# Patient Record
Sex: Female | Born: 1995 | Race: White | Hispanic: No | Marital: Married | Smoking: Never smoker
Health system: Southern US, Community
[De-identification: ages and names within clinical notes are randomized; demographics above are authoritative.]

## PROBLEM LIST (undated history)

## (undated) DIAGNOSIS — Z973 Presence of spectacles and contact lenses: Secondary | ICD-10-CM

## (undated) DIAGNOSIS — J45909 Unspecified asthma, uncomplicated: Secondary | ICD-10-CM

## (undated) DIAGNOSIS — J302 Other seasonal allergic rhinitis: Secondary | ICD-10-CM

## (undated) HISTORY — PX: MYOMECTOMY: SHX85

## (undated) HISTORY — PX: NO PAST SURGERIES: SHX2092

## (undated) HISTORY — PX: WISDOM TOOTH EXTRACTION: SHX21

## (undated) HISTORY — DX: Other seasonal allergic rhinitis: J30.2

## (undated) HISTORY — DX: Unspecified asthma, uncomplicated: J45.909

---
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
## 2016-03-29 HISTORY — PX: ORAL MUCOCELE EXCISION: SHX2111

## 2016-06-03 DIAGNOSIS — E7800 Pure hypercholesterolemia, unspecified: Secondary | ICD-10-CM

## 2016-06-03 HISTORY — DX: Pure hypercholesterolemia, unspecified: E78.00

## 2016-12-17 DIAGNOSIS — K130 Diseases of lips: Secondary | ICD-10-CM

## 2016-12-17 HISTORY — DX: Diseases of lips: K13.0

## 2018-02-12 DIAGNOSIS — N6310 Unspecified lump in the right breast, unspecified quadrant: Secondary | ICD-10-CM

## 2018-02-12 HISTORY — DX: Unspecified lump in the right breast, unspecified quadrant: N63.10

## 2019-11-28 ENCOUNTER — Other Ambulatory Visit (HOSPITAL_BASED_OUTPATIENT_CLINIC_OR_DEPARTMENT_OTHER): Payer: Self-pay | Admitting: Physician Assistant

## 2020-05-26 ENCOUNTER — Other Ambulatory Visit (HOSPITAL_BASED_OUTPATIENT_CLINIC_OR_DEPARTMENT_OTHER): Payer: Self-pay | Admitting: Physician Assistant

## 2020-06-06 ENCOUNTER — Other Ambulatory Visit: Payer: Self-pay

## 2020-06-06 ENCOUNTER — Other Ambulatory Visit: Payer: Self-pay | Admitting: Family Medicine

## 2020-06-06 ENCOUNTER — Ambulatory Visit: Payer: 59 | Admitting: Family Medicine

## 2020-06-06 ENCOUNTER — Encounter: Payer: Self-pay | Admitting: Family Medicine

## 2020-06-06 VITALS — BP 110/70 | HR 78 | Temp 97.8°F | Ht 64.0 in | Wt 150.2 lb

## 2020-06-06 DIAGNOSIS — B373 Candidiasis of vulva and vagina: Secondary | ICD-10-CM

## 2020-06-06 DIAGNOSIS — J4520 Mild intermittent asthma, uncomplicated: Secondary | ICD-10-CM | POA: Diagnosis not present

## 2020-06-06 DIAGNOSIS — J45909 Unspecified asthma, uncomplicated: Secondary | ICD-10-CM

## 2020-06-06 DIAGNOSIS — J302 Other seasonal allergic rhinitis: Secondary | ICD-10-CM

## 2020-06-06 DIAGNOSIS — B3731 Acute candidiasis of vulva and vagina: Secondary | ICD-10-CM

## 2020-06-06 HISTORY — DX: Unspecified asthma, uncomplicated: J45.909

## 2020-06-06 MED ORDER — ALBUTEROL SULFATE HFA 108 (90 BASE) MCG/ACT IN AERS: 2.0000 | INHALATION_SPRAY | Freq: Four times a day (QID) | RESPIRATORY_TRACT | 0 refills | Status: DC | PRN

## 2020-06-06 MED ORDER — LEVOCETIRIZINE DIHYDROCHLORIDE 5 MG PO TABS: 5.0000 mg | ORAL_TABLET | Freq: Every evening | ORAL | 2 refills | Status: DC

## 2020-06-06 MED ORDER — FLUCONAZOLE 150 MG PO TABS: ORAL_TABLET | ORAL | 0 refills | Status: DC

## 2020-06-06 NOTE — Patient Instructions (Addendum)
Call Center for [place] at [facility] at [phone] for an appointment.  They are located at [address], [place], [place], [address] (right across the hall from our office).  Use the Diflucan if you need.  Claritin (loratadine), Allegra (fexofenadine), Zyrtec (cetirizine) which is also equivalent to Xyzal (levocetirizine); these are listed in order from weakest to strongest. Generic, and therefore cheaper, options are in the parentheses.   Flonase (fluticasone); nasal spray that is over the counter. 2 sprays each nostril, once daily. Aim towards the same side eye when you spray.  There are available OTC, and the generic versions, which may be cheaper, are in parentheses. Show this to a pharmacist if you have trouble finding any of these items.  Let us know if you need anything.

## 2020-06-06 NOTE — Progress Notes (Signed)
Chief Complaint  Patient presents with  . New Patient (Initial Visit)    Possible yeast infection going on 1.5 weeks        New Patient Visit SUBJECTIVE: HPI: Crystal Soto is an 25 y.o.female who is being seen for establishing care.  Pt dealing w possible yeast infection over past 1.5 weeks. She had been using OTC Monostat with some relief. Still having some itching, not as much white/thick dc. No abd pain, fevers, urinary complaints, bleeding.  Pt w hx of allergies and allergy induced asthma. She has taken her mom's albuterol inhaler in the past that did help. She will intermittently take Claritin that is also helpful. No current breathing issues.   Past Medical History:  Diagnosis Date  . Allergy-induced asthma   . Seasonal allergies    Past Surgical History:  Procedure Laterality Date  . NO PAST SURGERIES     Family History  Problem Relation Age of Onset  . Cancer Neg Hx    No Known Allergies  Current Outpatient Medications:  .  albuterol (VENTOLIN HFA) 108 (90 Base) MCG/ACT inhaler, Inhale 2 puffs into the lungs every 6 (six) hours as needed for wheezing or shortness of breath., Disp: 8 g, Rfl: 0 .  Ascorbic Acid (VITAMIN C) 500 MG CAPS, , Disp: , Rfl:  .  fluconazole (DIFLUCAN) 150 MG tablet, Take 1 tab, repeat in 48 hours if no improvement., Disp: 2 tablet, Rfl: 0 .  levocetirizine (XYZAL) 5 MG tablet, Take 1 tablet (5 mg total) by mouth every evening., Disp: 30 tablet, Rfl: 2 .  levonorgestrel-ethinyl estradiol (ALESSE) 0.1-20 MG-MCG tablet, Take 1 tablet by mouth daily., Disp: , Rfl:  .  Multiple Vitamin (MULTIVITAMIN ADULT) TABS, , Disp: , Rfl:   OBJECTIVE: BP 110/70   Pulse 78   Temp 97.8 F (36.6 C) (Oral)   Ht 5\' 4"  (1.626 m)   Wt 150 lb 3.2 oz (68.1 kg)   SpO2 99%   BMI 25.78 kg/m  General:  well developed, well nourished, in no apparent distress Skin:  no significant moles, warts, or growths Lungs:  clear to auscultation, breath sounds equal  bilaterally, no respiratory distress Cardio:  regular rate and rhythm, no LE edema or bruits Abd: BS+, S, NT, ND Neuro:  gait normal Psych: well oriented with normal range of affect and appropriate judgment/insight  ASSESSMENT/PLAN: Yeast vaginitis - Plan: fluconazole (DIFLUCAN) 150 MG tablet  Mild intermittent extrinsic asthma without complication - Plan: albuterol (VENTOLIN HFA) 108 (90 Base) MCG/ACT inhaler  Seasonal allergies - Plan: levocetirizine (XYZAL) 5 MG tablet  1. May be improving, Diflucan sent it if doesn't resolve.  2. SABA prn.  3. Xyzal prn. Could use INCS.  Patient should return prn. The patient voiced understanding and agreement to the plan.   [place], DO 06/06/20  1:24 PM

## 2020-08-08 ENCOUNTER — Ambulatory Visit: Payer: 59 | Admitting: Family

## 2020-08-08 ENCOUNTER — Other Ambulatory Visit: Payer: Self-pay

## 2020-08-08 VITALS — BP 125/86 | HR 61 | Temp 98.2°F | Resp 16 | Ht 64.0 in | Wt 148.0 lb

## 2020-08-08 DIAGNOSIS — R0789 Other chest pain: Secondary | ICD-10-CM | POA: Diagnosis not present

## 2020-08-08 DIAGNOSIS — R002 Palpitations: Secondary | ICD-10-CM

## 2020-08-08 HISTORY — DX: Palpitations: R00.2

## 2020-08-08 LAB — CBC WITH DIFFERENTIAL/PLATELET
Basophils Absolute: 0 10*3/uL (ref 0.0–0.1)
Basophils Relative: 0.4 % (ref 0.0–3.0)
Eosinophils Absolute: 0.2 10*3/uL (ref 0.0–0.7)
Eosinophils Relative: 3 % (ref 0.0–5.0)
HCT: 45.2 % (ref 36.0–46.0)
Hemoglobin: 15.4 g/dL — ABNORMAL HIGH (ref 12.0–15.0)
Lymphocytes Relative: 38.2 % (ref 12.0–46.0)
Lymphs Abs: 2 10*3/uL (ref 0.7–4.0)
MCHC: 34 g/dL (ref 30.0–36.0)
MCV: 86.3 fl (ref 78.0–100.0)
Monocytes Absolute: 0.3 10*3/uL (ref 0.1–1.0)
Monocytes Relative: 5.4 % (ref 3.0–12.0)
Neutro Abs: 2.7 10*3/uL (ref 1.4–7.7)
Neutrophils Relative %: 53 % (ref 43.0–77.0)
Platelets: 236 10*3/uL (ref 150.0–400.0)
RBC: 5.24 Mil/uL — ABNORMAL HIGH (ref 3.87–5.11)
RDW: 12.4 % (ref 11.5–15.5)
WBC: 5.1 10*3/uL (ref 4.0–10.5)

## 2020-08-08 LAB — TSH: TSH: 3.36 u[IU]/mL (ref 0.35–4.50)

## 2020-08-08 LAB — BASIC METABOLIC PANEL
BUN: 12 mg/dL (ref 6–23)
CO2: 28 mEq/L (ref 19–32)
Calcium: 10.1 mg/dL (ref 8.4–10.5)
Chloride: 104 mEq/L (ref 96–112)
Creatinine, Ser: 0.77 mg/dL (ref 0.40–1.20)
GFR: 107.4 mL/min (ref >60.00)
Glucose, Bld: 78 mg/dL (ref 70–99)
Potassium: 4.6 mEq/L (ref 3.5–5.1)
Sodium: 140 mEq/L (ref 135–145)

## 2020-08-08 NOTE — Patient Instructions (Signed)
Please complete lab work prior to leaving. You should be contacted about scheduling your appointment with the cardiologist.

## 2020-08-08 NOTE — Assessment & Plan Note (Signed)
Suspect PVC's. EKG tracing is personally reviewed.  EKG notes NSR.  No acute changes. Check BMET, CBC, TSH and will refer to cardiology for zio monitor and further evaluation given her long standing hx of palpitations. We did discuss limiting caffeine intake as this can contribute to palpitations.

## 2020-08-08 NOTE — Progress Notes (Signed)
Subjective:   By signing my name below, I, Crystal Soto, attest that this documentation has been prepared under the direction and in the presence of Crystal Soto. 08/08/2020     Patient ID: Crystal Soto, female    DOB: Aug 16, 1995, 25 y.o.   MRN: [id number]  No chief complaint on file.   HPI Patient is in today for a office visit. She complains of having palpitations occasionally for the past 4 years. Yesterday she started having an episode of palpitations and mild hyperventilation after she drank coffee in the morning and ordered some more coffee later that morning. 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  She notes her pulse increasing during inspiration and decreasing during expiration. She denies having any chest pain and anxiety.   Past Medical History:  Diagnosis Date  . Allergy-induced asthma   . Seasonal allergies     Past Surgical History:  Procedure Laterality Date  . NO PAST SURGERIES      Family History  Problem Relation Age of Onset  . Cancer Neg Hx     Social History   Socioeconomic History  . Marital status: Single    Spouse name: Not on file  . Number of children: Not on file  . Years of education: Not on file  . Highest education level: Not on file  Occupational History  . Not on file  Tobacco Use  . Smoking status: Never Smoker  . Smokeless tobacco: Never Used  Substance and Sexual Activity  . Alcohol use: Yes    Alcohol/week: 2.0 standard drinks    Types: 2 Glasses of wine per week    Comment: weekend  . Drug use: Never  . Sexual activity: Yes    Birth control/protection: Pill  Other Topics Concern  . Not on file  Social History Narrative  . Not on file   Social Determinants of Health   Financial Resource Strain: Not on file  Food Insecurity: Not on file  Transportation Needs: Not on file  Physical Activity: Not on file  Stress: Not on file   Social Connections: Not on file  Intimate Partner Violence: Not on file    Outpatient Medications Prior to Visit  Medication Sig Dispense Refill  . albuterol (VENTOLIN HFA) 108 (90 Base) MCG/ACT inhaler INHALE 2 PUFFS BY MOUTH INTO THE LUNGS EVERY 6 (SIX) HOURS AS NEEDED FOR WHEEZING OR SHORTNESS OF BREATH. 18 g 0  . Ascorbic Acid (VITAMIN C) 500 MG CAPS     . fluconazole (DIFLUCAN) 150 MG tablet TAKE 1 TABLET BY MOUTH, REPEAT IN 48 HOURS IF NO IMPROVEMENT. 2 tablet 0  . levocetirizine (XYZAL) 5 MG tablet TAKE 1 TABLET (5 MG TOTAL) BY MOUTH EVERY EVENING. 30 tablet 2  . levonorgestrel-ethinyl estradiol (ALESSE) 0.1-20 MG-MCG tablet Take 1 tablet by mouth daily.    Marland Kitchen levonorgestrel-ethinyl estradiol (ALESSE) 0.1-20 MG-MCG tablet TAKE 1 TABLET BY MOUTH DAILY. 84 tablet 1  . levonorgestrel-ethinyl estradiol (ALESSE) 0.1-20 MG-MCG tablet TAKE 1 TABLET BY MOUTH ONCE DAILY 84 tablet 0  . Multiple Vitamin (MULTIVITAMIN ADULT) TABS      No facility-administered medications prior to visit.    No Known Allergies  ROS     Objective:    Physical Exam Constitutional:      Appearance: Normal appearance.  HENT:     Head: Normocephalic and atraumatic.     Right Ear: External ear  normal.     Left Ear: External ear normal.  Eyes:     Extraocular Movements: Extraocular movements intact.     Pupils: Pupils are equal, round, and reactive to light.  Cardiovascular:     Rate and Rhythm: Normal rate and regular rhythm.     Pulses: Normal pulses.     Heart sounds: Normal heart sounds. No murmur heard. No friction rub. No gallop.   Pulmonary:     Effort: Pulmonary effort is normal. No respiratory distress.     Breath sounds: No stridor. No wheezing, rhonchi or rales.  Skin:    General: Skin is warm and dry.  Neurological:     Mental Status: She is alert and oriented to person, place, and time.  Psychiatric:        Behavior: Behavior normal.     There were no vitals taken for this  visit. Wt Readings from Last 3 Encounters:  06/06/20 150 lb 3.2 oz (68.1 kg)    Diabetic Foot Exam - Simple   No data filed    No results found for: WBC, HGB, HCT, PLT, GLUCOSE, CHOL, TRIG, HDL, LDLDIRECT, LDLCALC, ALT, AST, NA, K, CL, CREATININE, BUN, CO2, TSH, PSA, INR, GLUF, HGBA1C, MICROALBUR  No results found for: TSH No results found for: WBC, HGB, HCT, MCV, PLT No results found for: NA, K, CHLORIDE, CO2, GLUCOSE, BUN, CREATININE, BILITOT, ALKPHOS, AST, ALT, PROT, ALBUMIN, CALCIUM, ANIONGAP, EGFR, GFR No results found for: CHOL No results found for: HDL No results found for: LDLCALC No results found for: TRIG No results found for: CHOLHDL No results found for: HGBA1C     Assessment & Plan:   Problem List Items Addressed This Visit   None      No orders of the defined types were placed in this encounter.   I, Crystal Soto, personally preformed the services described in this documentation.  All medical record entries made by the scribe were at my direction and in my presence.  I have reviewed the chart and discharge instructions (if applicable) and agree that the record reflects my personal performance and is accurate and complete. 08/08/2020   I,Crystal Soto,acting as a Education administrator for Crystal Soto.,have documented all relevant documentation on the behalf of Crystal Soto,as directed by  Crystal Soto while in the presence of Crystal Soto.   Crystal [employer]

## 2020-08-17 ENCOUNTER — Other Ambulatory Visit: Payer: Self-pay

## 2020-08-18 ENCOUNTER — Other Ambulatory Visit (HOSPITAL_BASED_OUTPATIENT_CLINIC_OR_DEPARTMENT_OTHER): Payer: Self-pay

## 2020-08-18 MED ORDER — LEVONORGESTREL-ETHINYL ESTRAD 0.1-20 MG-MCG PO TABS
1.0000 | ORAL_TABLET | Freq: Every morning | ORAL | 1 refills | Status: DC
  Filled 2020-08-18: qty 84, 84d supply, fill #0

## 2020-08-26 ENCOUNTER — Ambulatory Visit: Payer: Self-pay | Admitting: Medical

## 2020-08-26 ENCOUNTER — Other Ambulatory Visit (HOSPITAL_BASED_OUTPATIENT_CLINIC_OR_DEPARTMENT_OTHER): Payer: Self-pay

## 2020-08-27 ENCOUNTER — Other Ambulatory Visit: Payer: Self-pay

## 2020-08-27 ENCOUNTER — Ambulatory Visit (INDEPENDENT_AMBULATORY_CARE_PROVIDER_SITE_OTHER): Payer: 59 | Admitting: Family Medicine

## 2020-08-27 ENCOUNTER — Encounter: Payer: Self-pay | Admitting: Family Medicine

## 2020-08-27 ENCOUNTER — Other Ambulatory Visit (HOSPITAL_BASED_OUTPATIENT_CLINIC_OR_DEPARTMENT_OTHER): Payer: Self-pay

## 2020-08-27 VITALS — BP 108/68 | HR 68 | Temp 98.0°F | Ht 64.0 in | Wt 152.0 lb

## 2020-08-27 DIAGNOSIS — Z114 Encounter for screening for human immunodeficiency virus [HIV]: Secondary | ICD-10-CM | POA: Diagnosis not present

## 2020-08-27 DIAGNOSIS — Z0000 Encounter for general adult medical examination without abnormal findings: Secondary | ICD-10-CM

## 2020-08-27 MED ORDER — LEVONORGESTREL-ETHINYL ESTRAD 0.1-20 MG-MCG PO TABS
1.0000 | ORAL_TABLET | Freq: Every morning | ORAL | 1 refills | Status: DC
  Filled 2020-08-27: qty 84, 84d supply, fill #0
  Filled 2020-11-12: qty 84, 84d supply, fill #1

## 2020-08-27 NOTE — Patient Instructions (Addendum)
Give [place] 2-3 business days to get the results of your labs back.   Keep the diet clean and stay active.  Let us know if you need anything.  Knee Exercises It is normal to feel mild stretching, pulling, tightness, or discomfort as you do these exercises, but you should stop right away if you feel sudden pain or your pain gets worse. STRETCHING AND RANGE OF MOTION EXERCISES  These exercises warm up your muscles and joints and improve the movement and flexibility of your knee. These exercises also help to relieve pain, numbness, and tingling. Exercise A: Knee Extension, Prone  1. Lie on your abdomen on a bed. 2. Place your left / right knee just beyond the edge of the surface so your knee is not on the bed. You can put a towel under your left / right thigh just above your knee for comfort. 3. Relax your leg muscles and allow gravity to straighten your knee. You should feel a stretch behind your left / right knee. 4. Hold this position for 30 seconds. 5. Scoot up so your knee is supported between repetitions. Repeat 2 times. Complete this stretch 3 times per week. Exercise B: Knee Flexion, Active    1. Lie on your back with both knees straight. If this causes back discomfort, bend your left / right knee so your foot is flat on the floor. 2. Slowly slide your left / right heel back toward your buttocks until you feel a gentle stretch in the front of your knee or thigh. 3. Hold this position for 30 seconds. 4. Slowly slide your left / right heel back to the starting position. Repeat 2 times. Complete this exercise 3 times per week. Exercise C: Quadriceps, Prone    1. Lie on your abdomen on a firm surface, such as a bed or padded floor. 2. Bend your left / right knee and hold your ankle. If you cannot reach your ankle or pant leg, loop a belt around your foot and grab the belt instead. 3. Gently pull your heel toward your buttocks. Your knee should not slide out to the side. You should feel  a stretch in the front of your thigh and knee. 4. Hold this position for 30 seconds. Repeat 2 times. Complete this stretch 3 times per week. Exercise D: Hamstring, Supine  1. Lie on your back. 2. Loop a belt or towel over the ball of your left / right foot. The ball of your foot is on the walking surface, right under your toes. 3. Straighten your left / right knee and slowly pull on the belt to raise your leg until you feel a gentle stretch behind your knee. ? Do not let your left / right knee bend while you do this. ? Keep your other leg flat on the floor. 4. Hold this position for 30 seconds. Repeat 2 times. Complete this stretch 3 times per week. STRENGTHENING EXERCISES  These exercises build strength and endurance in your knee. Endurance is the ability to use your muscles for a long time, even after they get tired. Exercise E: Quadriceps, Isometric    1. Lie on your back with your left / right leg extended and your other knee bent. Put a rolled towel or small pillow under your knee if told by your health care provider. 2. Slowly tense the muscles in the front of your left / right thigh. You should see your kneecap slide up toward your hip or see increased dimpling just  above the knee. This motion will push the back of the knee toward the floor. 3. For 3 seconds, keep the muscle as tight as you can without increasing your pain. 4. Relax the muscles slowly and completely. Repeat for 10 total reps Repeat 2 ti mes. Complete this exercise 3 times per week. Exercise F: Straight Leg Raises - Quadriceps  1. Lie on your back with your left / right leg extended and your other knee bent. 2. Tense the muscles in the front of your left / right thigh. You should see your kneecap slide up or see increased dimpling just above the knee. Your thigh may even shake a bit. 3. Keep these muscles tight as you raise your leg 4-6 inches (10-15 cm) off the floor. Do not let your knee bend. 4. Hold this position  for 3 seconds. 5. Keep these muscles tense as you lower your leg. 6. Relax your muscles slowly and completely after each repetition. 10 total reps. Repeat 2 times. Complete this exercise 3 times per week.  Exercise G: Hamstring Curls    If told by your health care provider, do this exercise while wearing ankle weights. Begin with 5 lb weights (optional). Then increase the weight by 1 lb (0.5 kg) increments. Do not wear ankle weights that are more than 20 lbs to start with. 1. Lie on your abdomen with your legs straight. 2. Bend your left / right knee as far as you can without feeling pain. Keep your hips flat against the floor. 3. Hold this position for 3 seconds. 4. Slowly lower your leg to the starting position. Repeat for 10 reps.  Repeat 2 times. Complete this exercise 3 times per week. Exercise H: Squats (Quadriceps)  1. Stand in front of a table, with your feet and knees pointing straight ahead. You may rest your hands on the table for balance but not for support. 2. Slowly bend your knees and lower your hips like you are going to sit in a chair. ? Keep your weight over your heels, not over your toes. ? Keep your lower legs upright so they are parallel with the table legs. ? Do not let your hips go lower than your knees. ? Do not bend lower than told by your health care provider. ? If your knee pain increases, do not bend as low. 3. Hold the squat position for 1 second. 4. Slowly push with your legs to return to standing. Do not use your hands to pull yourself to standing. Repeat 2 times. Complete this exercise 3 times per week. Exercise I: Wall Slides (Quadriceps)    1. Lean your back against a smooth wall or door while you walk your feet out 18-24 inches (46-61 cm) from it. 2. Place your feet hip-width apart. 3. Slowly slide down the wall or door until your knees Repeat 2 times. Complete this exercise every other day. 4. Exercise K: Straight Leg Raises - Hip Abductors   1. Lie on your side with your left / right leg in the top position. Lie so your head, shoulder, knee, and hip line up. You may bend your bottom knee to help you keep your balance. 2. Roll your hips slightly forward so your hips are stacked directly over each other and your left / right knee is facing forward. 3. Leading with your heel, lift your top leg 4-6 inches (10-15 cm). You should feel the muscles in your outer hip lifting. ? Do not let your foot drift forward. ?  Do not let your knee roll toward the ceiling. 4. Hold this position for 3 seconds. 5. Slowly return your leg to the starting position. 6. Let your muscles relax completely after each repetition. 10 total reps. Repeat 2 times. Complete this exercise 3 times per week. Exercise J: Straight Leg Raises - Hip Extensors  1. Lie on your abdomen on a firm surface. You can put a pillow under your hips if that is more comfortable. 2. Tense the muscles in your buttocks and lift your left / right leg about 4-6 inches (10-15 cm). Keep your knee straight as you lift your leg. 3. Hold this position for 3 seconds. 4. Slowly lower your leg to the starting position. 5. Let your leg relax completely after each repetition. Repeat 2 times. Complete this exercise 3 times per week. Document Released: 01/27/2005 Document Revised: 12/08/2015 Document Reviewed: 01/19/2015 Elsevier Interactive Patient Education  2017 [employer].

## 2020-08-27 NOTE — Progress Notes (Signed)
Chief Complaint  Patient presents with  . Annual Exam     Well Woman Crystal Soto is here for a complete physical.   Her last physical was last year.  Current diet: in general, a "healthy" diet. Current exercise: walking. Contraception? Yes Fatigue out of ordinary? No Seatbelt? Yes  Health Maintenance Pap/HPV- Yes Tetanus- Yes HIV screening- No Hep C screening- Yes  Past Medical History:  Diagnosis Date  . Allergy-induced asthma   . Seasonal allergies      Past Surgical History:  Procedure Laterality Date  . NO PAST SURGERIES      Medications  Current Outpatient Medications on File Prior to Visit  Medication Sig Dispense Refill  . albuterol (VENTOLIN HFA) 108 (90 Base) MCG/ACT inhaler INHALE 2 PUFFS BY MOUTH INTO THE LUNGS EVERY 6 (SIX) HOURS AS NEEDED FOR WHEEZING OR SHORTNESS OF BREATH. 18 g 0  . Ascorbic Acid (VITAMIN C) 500 MG CAPS     . fluconazole (DIFLUCAN) 150 MG tablet TAKE 1 TABLET BY MOUTH, REPEAT IN 48 HOURS IF NO IMPROVEMENT. 2 tablet 0  . levonorgestrel-ethinyl estradiol (ALESSE) 0.1-20 MG-MCG tablet TAKE 1 TABLET BY MOUTH ONCE DAILY 84 tablet 0  . levonorgestrel-ethinyl estradiol (ALESSE) 0.1-20 MG-MCG tablet Take 1 tablet by mouth in the morning. 84 tablet 1  . Multiple Vitamin (MULTIVITAMIN ADULT) TABS       Allergies No Known Allergies  Review of Systems: Constitutional:  no unexpected weight changes Eye:  no recent significant change in vision Ear/Nose/Mouth/Throat:  Ears:  no tinnitus or vertigo and no recent change in hearing Nose/Mouth/Throat:  no complaints of nasal congestion, no sore throat Cardiovascular: no chest pain Respiratory:  no cough and no shortness of breath Gastrointestinal:  no abdominal pain, no change in bowel habits GU:  Female: negative for dysuria or pelvic pain Musculoskeletal/Extremities:  +R knee pain; otherwise no new pain of the joints Integumentary (Skin/Breast):  no abnormal skin lesions  reported Neurologic:  no headaches Endocrine:  denies fatigue Hematologic/Lymphatic:  No areas of easy bleeding  Exam BP 108/68 (BP Location: Left Arm, Patient Position: Sitting, Cuff Size: Normal)   Pulse 68   Temp 98 F (36.7 C) (Oral)   Ht 5\' 4"  (1.626 m)   Wt 152 lb (68.9 kg)   SpO2 99%   BMI 26.09 kg/m  General:  well developed, well nourished, in no apparent distress Skin:  no significant moles, warts, or growths Head:  no masses, lesions, or tenderness Eyes:  pupils equal and round, sclera anicteric without injection Ears:  canals without lesions, TMs shiny without retraction, no obvious effusion, no erythema Nose:  nares patent, septum midline, mucosa normal, and no drainage or sinus tenderness Throat/Pharynx:  lips and gingiva without lesion; tongue and uvula midline; non-inflamed pharynx; no exudates or postnasal drainage Neck: neck supple without adenopathy, thyromegaly, or masses Lungs:  clear to auscultation, breath sounds equal bilaterally, no respiratory distress Cardio:  regular rate and rhythm, no bruits, no LE edema Abdomen:  abdomen soft, nontender; bowel sounds normal; no masses or organomegaly Genital: Defer to GYN Musculoskeletal:  symmetrical muscle groups noted without atrophy or deformity Extremities:  no clubbing, cyanosis, or edema, no deformities, no skin discoloration Neuro:  gait normal; deep tendon reflexes normal and symmetric Psych: well oriented with normal range of affect and appropriate judgment/insight  Assessment and Plan  Well adult exam - Plan: CBC, Comprehensive metabolic panel, Lipid panel  Screening for HIV (human immunodeficiency virus) - Plan: HIV Antibody (routine  testing w rflx)   Well 25 y.o. female. Counseled on diet and exercise. Other orders as above. Mild strain to quad. Will give stretches/exercises. PT if no better.  Follow up in . The patient voiced understanding and agreement to the plan.  [place],  DO 08/27/20 8:38 AM

## 2020-09-04 NOTE — Addendum Note (Signed)
Addended by: Kelle Darting A on: 09/04/2020 08:51 AM   Modules accepted: Orders

## 2020-09-05 ENCOUNTER — Other Ambulatory Visit (INDEPENDENT_AMBULATORY_CARE_PROVIDER_SITE_OTHER): Payer: 59

## 2020-09-05 ENCOUNTER — Other Ambulatory Visit: Payer: Self-pay

## 2020-09-05 DIAGNOSIS — Z0000 Encounter for general adult medical examination without abnormal findings: Secondary | ICD-10-CM | POA: Diagnosis not present

## 2020-09-05 DIAGNOSIS — Z114 Encounter for screening for human immunodeficiency virus [HIV]: Secondary | ICD-10-CM

## 2020-09-05 LAB — COMPREHENSIVE METABOLIC PANEL
ALT: 19 U/L (ref 0–35)
AST: 17 U/L (ref 0–37)
Albumin: 4.7 g/dL (ref 3.5–5.2)
Alkaline Phosphatase: 44 U/L (ref 39–117)
BUN: 12 mg/dL (ref 6–23)
CO2: 26 mEq/L (ref 19–32)
Calcium: 9.6 mg/dL (ref 8.4–10.5)
Chloride: 105 mEq/L (ref 96–112)
Creatinine, Ser: 0.79 mg/dL (ref 0.40–1.20)
GFR: 104.09 mL/min (ref >60.00)
Glucose, Bld: 83 mg/dL (ref 70–99)
Potassium: 4.7 mEq/L (ref 3.5–5.1)
Sodium: 139 mEq/L (ref 135–145)
Total Bilirubin: 0.9 mg/dL (ref 0.2–1.2)
Total Protein: 6.8 g/dL (ref 6.0–8.3)

## 2020-09-05 LAB — CBC
HCT: 43 % (ref 36.0–46.0)
Hemoglobin: 14.8 g/dL (ref 12.0–15.0)
MCHC: 34.4 g/dL (ref 30.0–36.0)
MCV: 86.8 fl (ref 78.0–100.0)
Platelets: 229 10*3/uL (ref 150.0–400.0)
RBC: 4.95 Mil/uL (ref 3.87–5.11)
RDW: 12.9 % (ref 11.5–15.5)
WBC: 5.5 10*3/uL (ref 4.0–10.5)

## 2020-09-05 LAB — LIPID PANEL
Cholesterol: 208 mg/dL — ABNORMAL HIGH (ref 0–200)
HDL: 50.3 mg/dL (ref >39.00)
LDL Cholesterol: 141 mg/dL — ABNORMAL HIGH (ref 0–99)
NonHDL: 158.03
Total CHOL/HDL Ratio: 4
Triglycerides: 87 mg/dL (ref 0.0–149.0)
VLDL: 17.4 mg/dL (ref 0.0–40.0)

## 2020-09-08 LAB — HIV ANTIBODY (ROUTINE TESTING W REFLEX): HIV 1&2 Ab, 4th Generation: NONREACTIVE

## 2020-09-17 ENCOUNTER — Other Ambulatory Visit: Payer: Self-pay | Admitting: Family Medicine

## 2020-09-17 ENCOUNTER — Other Ambulatory Visit (HOSPITAL_BASED_OUTPATIENT_CLINIC_OR_DEPARTMENT_OTHER): Payer: Self-pay

## 2020-09-17 MED ORDER — PROMETHAZINE HCL 25 MG PO TABS
25.0000 mg | ORAL_TABLET | Freq: Three times a day (TID) | ORAL | 0 refills | Status: DC | PRN
  Filled 2020-09-17: qty 30, 10d supply, fill #0

## 2020-09-25 ENCOUNTER — Encounter: Payer: Self-pay | Admitting: Cardiology

## 2020-09-25 ENCOUNTER — Ambulatory Visit (INDEPENDENT_AMBULATORY_CARE_PROVIDER_SITE_OTHER): Payer: 59

## 2020-09-25 ENCOUNTER — Other Ambulatory Visit: Payer: Self-pay

## 2020-09-25 ENCOUNTER — Ambulatory Visit (INDEPENDENT_AMBULATORY_CARE_PROVIDER_SITE_OTHER): Payer: 59 | Admitting: Cardiology

## 2020-09-25 VITALS — BP 122/68 | HR 78 | Ht 64.0 in | Wt 151.1 lb

## 2020-09-25 DIAGNOSIS — E7800 Pure hypercholesterolemia, unspecified: Secondary | ICD-10-CM

## 2020-09-25 DIAGNOSIS — R011 Cardiac murmur, unspecified: Secondary | ICD-10-CM

## 2020-09-25 DIAGNOSIS — R002 Palpitations: Secondary | ICD-10-CM

## 2020-09-25 HISTORY — DX: Cardiac murmur, unspecified: R01.1

## 2020-09-25 NOTE — Patient Instructions (Signed)
Medication Instructions:  No medication changes. *If you need a refill on your cardiac medications before your next appointment, please call your pharmacy*   Lab Work: None ordered If you have labs (blood work) drawn today and your tests are completely normal, you will receive your results only by: Dora (if you have MyChart) OR A paper copy in the mail If you have any lab test that is abnormal or we need to change your treatment, we will call you to review the results.   Testing/Procedures: Your physician has requested that you have an echocardiogram. Echocardiography is a painless test that uses sound waves to create images of your heart. It provides your doctor with information about the size and shape of your heart and how well your heart's chambers and valves are working. This procedure takes approximately one hour. There are no restrictions for this procedure.   WHY IS MY DOCTOR PRESCRIBING ZIO? The Zio system is proven and trusted by physicians to detect and diagnose irregular heart rhythms -- and has been prescribed to hundreds of thousands of patients.  The FDA has cleared the Zio system to monitor for many different kinds of irregular heart rhythms. In a study, physicians were able to reach a diagnosis 90% of the time with the Zio system1.  You can wear the Zio monitor -- a small, discreet, comfortable patch -- during your normal day-to-day activity, including while you sleep, shower, and exercise, while it records every single heartbeat for analysis.  1Barrett, P., et al. Comparison of 24 Hour Holter Monitoring Versus 14 Day Novel Adhesive Patch Electrocardiographic Monitoring. [place], 2014.  ZIO VS. HOLTER MONITORING The Zio monitor can be comfortably worn for up to 14 days. Holter monitors can be worn for 24 to 48 hours, limiting the time to record any irregular heart rhythms you may have. Zio is able to capture data for the 51% of patients who  have their first symptom-triggered arrhythmia after 48 hours.1  LIVE WITHOUT RESTRICTIONS The Zio ambulatory cardiac monitor is a small, unobtrusive, and water-resistant patch--you might even forget you're wearing it. The Zio monitor records and stores every beat of your heart, whether you're sleeping, working out, or showering. Wear the monitor for 14 days, remove 10/09/20.  Follow-Up: At [facility], you and your health needs are our priority.  As part of our continuing mission to provide you with exceptional heart care, we have created designated Provider Care Teams.  These Care Teams include your primary Cardiologist (physician) and Advanced Practice Providers (APPs -  Physician Assistants and Nurse Practitioners) who all work together to provide you with the care you need, when you need it.  We recommend signing up for the patient portal called "MyChart".  Sign up information is provided on this After Visit Summary.  MyChart is used to connect with patients for Virtual Visits (Telemedicine).  Patients are able to view lab/test results, encounter notes, upcoming appointments, etc.  Non-urgent messages can be sent to your provider as well.   To learn more about what you can do with MyChart, go to [email or web address].    Your next appointment:   6 month(s)  The format for your next appointment:   In Person  Provider:   Jyl Heinz, MD   Other Instructions Echocardiogram An echocardiogram is a test that uses sound waves (ultrasound) to produce images of the heart. Images from an echocardiogram can provide important information about: Heart size and shape. The size and thickness and movement  of your heart's walls. Heart muscle function and strength. Heart valve function or if you have stenosis. Stenosis is when the heart valves are too narrow. If blood is flowing backward through the heart valves (regurgitation). A tumor or infectious growth around the heart valves. Areas of  heart muscle that are not working well because of poor blood flow or injury from a heart attack. Aneurysm detection. An aneurysm is a weak or damaged part of an artery wall. The wall bulges out from the normal force of blood pumping through the body. Tell a health care provider about: Any allergies you have. All medicines you are taking, including vitamins, herbs, eye drops, creams, and over-the-counter medicines. Any blood disorders you have. Any surgeries you have had. Any medical conditions you have. Whether you are pregnant or may be pregnant. What are the risks? Generally, this is a safe test. However, problems may occur, including an allergic reaction to dye (contrast) that may be used during the test. What happens before the test? No specific preparation is needed. You may eat and drink normally. What happens during the test? You will take off your clothes from the waist up and put on a hospital gown. Electrodes or electrocardiogram (ECG)patches may be placed on your chest. The electrodes or patches are then connected to a device that monitors your heart rate and rhythm. You will lie down on a table for an ultrasound exam. A gel will be applied to your chest to help sound waves pass through your skin. A handheld device, called a transducer, will be pressed against your chest and moved over your heart. The transducer produces sound waves that travel to your heart and bounce back (or "echo" back) to the transducer. These sound waves will be captured in real-time and changed into images of your heart that can be viewed on a video monitor. The images will be recorded on a computer and reviewed by your health care provider. You may be asked to change positions or hold your breath for a short time. This makes it easier to get different views or better views of your heart. In some cases, you may receive contrast through an IV in one of your veins. This can improve the quality of the pictures from  your heart. The procedure may vary among health care providers and hospitals.   What can I expect after the test? You may return to your normal, everyday life, including diet, activities, and medicines, unless your health care provider tells you not to do that. Follow these instructions at home: It is up to you to get the results of your test. Ask your health care provider, or the department that is doing the test, when your results will be ready. Keep all follow-up visits. This is important. Summary An echocardiogram is a test that uses sound waves (ultrasound) to produce images of the heart. Images from an echocardiogram can provide important information about the size and shape of your heart, heart muscle function, heart valve function, and other possible heart problems. You do not need to do anything to prepare before this test. You may eat and drink normally. After the echocardiogram is completed, you may return to your normal, everyday life, unless your health care provider tells you not to do that. This information is not intended to replace advice given to you by your health care provider. Make sure you discuss any questions you have with your health care provider. Document Revised: 11/06/2019 Document Reviewed: 11/06/2019 Elsevier Patient Education  [place].

## 2020-09-25 NOTE — Progress Notes (Signed)
Cardiology Office Note:    Date:  09/25/2020   ID:  Crystal Soto, DOB [birth year], MRN [id number]  PCP:  Shelda Pal, DO  Cardiologist:  Jenean Lindau, MD   Referring MD: Debbrah Alar, NP    ASSESSMENT:    1. Murmur, cardiac   2. Palpitations   3. Cardiac murmur   4. Hypercholesteremia    PLAN:    In order of problems listed above:  Primary prevention stressed with the patient.  Importance of compliance with diet medication stressed and she vocalized understanding.  She was advised to walk at least walk half an hour a day on a regular basis and she promises to do so. 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  She is a Marine scientist by profession.  After her coffee intake has decreased she has had no issues with this. Palpitations: She is concerned about it.  TSH was normal.  We will do a 2-week monitor.  I also advised her to purchase a cardia app to monitor herself on a as needed basis.  She agrees. Cardiac murmur: Echocardiogram will be done to assess murmur on auscultation. Mixed dyslipidemia: Diet was emphasized.  Lipids were discussed and risks explained and she vocalized understanding.  She promises to comply. Patient will be seen in follow-up appointment in 6 months or earlier if the patient has any concerns    Medication Adjustments/Labs and Tests Ordered: Current medicines are reviewed at length with the patient today.  Concerns regarding medicines are outlined above.  Orders Placed This Encounter  Procedures   LONG TERM MONITOR (3-14 DAYS)   EKG 12-Lead   ECHOCARDIOGRAM COMPLETE   No orders of the defined types were placed in this encounter.    History of Present Illness:    Crystal Soto is a 25 y.o. female who is being seen today for the evaluation of palpitations at the request of Debbrah Alar, NP.  Patient is a pleasant 25 year old female.  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  She mentions to me  that she had extra 2 coffees the other day and subsequently started having palpitations and was noted to have PVCs.  No chest pain orthopnea or PND.  She is an otherwise healthy female.  She is active and exercises on a regular basis.  She has never had passing out spell.  At the time of my evaluation, the patient is alert awake oriented and in no distress.  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 Right breast.  Follow by Dr. Duke Salvia.  Stable.  No intervention.   Hypercholesteremia 06/03/2016   Mucocele of lower lip 12/17/2016   Palpitations 08/08/2020   Seasonal allergies     Past Surgical History:  Procedure Laterality Date   NO PAST SURGERIES      Current Medications: Current Meds  Medication Sig   albuterol (VENTOLIN HFA) 108 (90 Base) MCG/ACT inhaler Inhale 2 puffs into the lungs every 6 (six) hours as needed for wheezing or shortness of breath.   Ascorbic Acid (VITAMIN C) 500 MG CAPS Take 500 mg by mouth daily.   levonorgestrel-ethinyl estradiol (ALESSE) 0.1-20 MG-MCG tablet Take 1 tablet by mouth in the morning.   Multiple Vitamin (MULTIVITAMIN ADULT) TABS Take 1 tablet by mouth daily.   promethazine (  PHENERGAN) 25 MG tablet Take 1 tablet (25 mg total) by mouth every 8 (eight) hours as needed for nausea or vomiting (Anxiety).     Allergies:   Patient has no known allergies.   Social History   Socioeconomic History   Marital status: Single    Spouse name: Not on file   Number of children: Not on file   Years of education: Not on file   Highest education level: Not on file  Occupational History   Not on file  Tobacco Use   Smoking status: Never   Smokeless tobacco: Never  Substance and Sexual Activity   Alcohol use: Yes    Alcohol/week: 2.0 standard drinks    Types: 2 Glasses of wine per  week    Comment: weekend   Drug use: Never   Sexual activity: Yes    Birth control/protection: Pill  Other Topics Concern   Not on file  Social History Narrative   Not on file   Social Determinants of Health   Financial Resource Strain: Not on file  Food Insecurity: Not on file  Transportation Needs: Not on file  Physical Activity: Not on file  Stress: Not on file  Social Connections: Not on file     Family History: The patient's family history includes Cancer in her maternal grandmother; Heart disease in her paternal grandfather; High Cholesterol in her maternal grandfather, maternal grandmother, and mother; Hypertension in her maternal grandfather.  ROS:   Please see the history of present illness.    All other systems reviewed and are negative.  EKGs/Labs/Other Studies Reviewed:    The following studies were reviewed today: EKG reveals sinus rhythm and nonspecific ST-T changes   Recent Labs: 08/08/2020: TSH 3.36 09/05/2020: ALT 19; BUN 12; Creatinine, Ser 0.79; Hemoglobin 14.8; Platelets 229.0; Potassium 4.7; Sodium 139  Recent Lipid Panel    Component Value Date/Time   CHOL 208 (H) 09/05/2020 0752   TRIG 87.0 09/05/2020 0752   HDL 50.30 09/05/2020 0752   CHOLHDL 4 09/05/2020 0752   VLDL 17.4 09/05/2020 0752   LDLCALC 141 (H) 09/05/2020 0752    Physical Exam:    VS:  BP 122/68   Pulse 78   Ht 5\' 4"  (1.626 m)   Wt 151 lb 1.3 oz (68.5 kg)   SpO2 99%   BMI 25.93 kg/m     Wt Readings from Last 3 Encounters:  09/25/20 151 lb 1.3 oz (68.5 kg)  08/27/20 152 lb (68.9 kg)  08/08/20 148 lb (67.1 kg)     GEN: Patient is in no acute distress HEENT: Normal NECK: No JVD; No carotid bruits LYMPHATICS: No lymphadenopathy CARDIAC: S1 S2 regular, 2/6 systolic murmur at the apex. RESPIRATORY:  Clear to auscultation without rales, wheezing or rhonchi  ABDOMEN: Soft, non-tender, non-distended MUSCULOSKELETAL:  No edema; No deformity  SKIN: Warm and dry NEUROLOGIC:   Alert and oriented x 3 PSYCHIATRIC:  Normal affect    Signed, Jenean Lindau, MD  09/25/2020 10:40 AM    [facility]

## 2020-10-14 DIAGNOSIS — R002 Palpitations: Secondary | ICD-10-CM | POA: Diagnosis not present

## 2020-10-15 ENCOUNTER — Other Ambulatory Visit: Payer: Self-pay

## 2020-10-15 ENCOUNTER — Ambulatory Visit (HOSPITAL_BASED_OUTPATIENT_CLINIC_OR_DEPARTMENT_OTHER)
Admission: RE | Admit: 2020-10-15 | Discharge: 2020-10-15 | Disposition: A | Discharge status: Home / Self Care | Payer: 59 | Source: Ambulatory Visit | Attending: Cardiology | Admitting: Cardiology

## 2020-10-15 DIAGNOSIS — R011 Cardiac murmur, unspecified: Secondary | ICD-10-CM | POA: Insufficient documentation

## 2020-10-15 LAB — ECHOCARDIOGRAM COMPLETE
AR max vel: 3.27 cm2
AV Area VTI: 2.97 cm2
AV Area mean vel: 3.13 cm2
AV Mean grad: 4 mmHg
AV Peak grad: 7.4 mmHg
Ao pk vel: 1.36 m/s
Area-P 1/2: 4.96 cm2
Calc EF: 57.3 %
S' Lateral: 3.3 cm
Single Plane A2C EF: 53.8 %
Single Plane A4C EF: 61.1 %

## 2020-11-05 ENCOUNTER — Other Ambulatory Visit (HOSPITAL_BASED_OUTPATIENT_CLINIC_OR_DEPARTMENT_OTHER): Payer: Self-pay

## 2020-11-05 MED ORDER — CARESTART COVID-19 HOME TEST VI KIT
PACK | 0 refills | Status: DC
  Filled 2020-11-05: qty 2, 4d supply, fill #0

## 2020-11-12 ENCOUNTER — Other Ambulatory Visit (HOSPITAL_BASED_OUTPATIENT_CLINIC_OR_DEPARTMENT_OTHER): Payer: Self-pay

## 2020-12-04 DIAGNOSIS — U071 COVID-19: Secondary | ICD-10-CM | POA: Insufficient documentation

## 2020-12-04 HISTORY — DX: COVID-19: U07.1

## 2020-12-31 ENCOUNTER — Encounter: Payer: 59 | Admitting: Family Medicine

## 2021-01-14 ENCOUNTER — Other Ambulatory Visit (HOSPITAL_BASED_OUTPATIENT_CLINIC_OR_DEPARTMENT_OTHER): Payer: Self-pay

## 2021-01-14 MED ORDER — INFLUENZA VAC SPLIT QUAD 0.5 ML IM SUSY
PREFILLED_SYRINGE | INTRAMUSCULAR | 0 refills | Status: DC
  Filled 2021-01-14: qty 0.5, 1d supply, fill #0

## 2021-01-15 ENCOUNTER — Encounter: Payer: Self-pay | Admitting: Family Medicine

## 2021-01-15 ENCOUNTER — Ambulatory Visit: Payer: 59 | Admitting: Family Medicine

## 2021-01-15 ENCOUNTER — Other Ambulatory Visit: Payer: Self-pay

## 2021-01-15 VITALS — BP 98/80 | HR 81 | Temp 97.8°F | Resp 18 | Ht 64.0 in | Wt 154.0 lb

## 2021-01-15 DIAGNOSIS — R198 Other specified symptoms and signs involving the digestive system and abdomen: Secondary | ICD-10-CM

## 2021-01-15 NOTE — Patient Instructions (Signed)
Someone will reach out in the next week or so regarding scheduling your ultrasound.   Let us know if you need anything.

## 2021-01-15 NOTE — Progress Notes (Signed)
Chief Complaint  Patient presents with   Mass    X2 month, abdominal lump, Pt states no pain, or redness, no urinary freq, Pt states she notices it more when she has use the bathroom.     Subjective: Patient is a 25 y.o. female here for abd issue.  Around 2 months ago the patient noticed some fullness in her lower abdominal region, deviating to the left, when she was on her cycle.  When she has to urinate it feels like it rises higher in her abdomen.  She does have a family history of uterine fibroids.  She is recently married and is interested in having kids.  She is wondering if she requires any imaging to rule out anything that would cause her any issues with this.  She denies any fevers, vaginal bleeding, urinary/bowel changes, unintentional weight loss, nausea, or vomiting.  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 Right breast.  Follow by Dr. Duke Salvia.  Stable.  No intervention.   Hypercholesteremia 06/03/2016   Mucocele of lower lip 12/17/2016   Palpitations 08/08/2020   Seasonal allergies     Objective: BP 98/80 (BP Location: Left Arm, Patient Position: Sitting, Cuff Size: Normal)   Pulse 81   Temp 97.8 F (36.6 C) (Oral)   Resp 18   Ht 5\' 4"  (1.626 m)   Wt 154 lb (69.9 kg)   LMP 12/30/2020   SpO2 98%   BMI 26.43 kg/m  General: Awake, appears stated age Abdomen: Bowel sounds present, soft, nontender, nondistended, no masses or organomegaly Skin: No rashes or deformities on the abdominal wall Lungs: No accessory muscle use Psych: Age appropriate judgment and insight, normal affect and mood  Assessment and Plan: Abdominal fullness in left lower quadrant - Plan: US Pelvic Complete With Transvaginal  Check ultrasound, 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  Further  management pending the results. The patient voiced understanding and agreement to the plan.  [place], DO 01/15/21  2:00 PM

## 2021-01-23 ENCOUNTER — Ambulatory Visit (HOSPITAL_BASED_OUTPATIENT_CLINIC_OR_DEPARTMENT_OTHER)
Admission: RE | Admit: 2021-01-23 | Discharge: 2021-01-23 | Disposition: A | Discharge status: Home / Self Care | Payer: 59 | Source: Ambulatory Visit | Attending: Family Medicine | Admitting: Family Medicine

## 2021-01-23 ENCOUNTER — Other Ambulatory Visit: Payer: Self-pay | Admitting: Family Medicine

## 2021-01-23 ENCOUNTER — Other Ambulatory Visit: Payer: Self-pay

## 2021-01-23 DIAGNOSIS — N9489 Other specified conditions associated with female genital organs and menstrual cycle: Secondary | ICD-10-CM

## 2021-01-23 DIAGNOSIS — R198 Other specified symptoms and signs involving the digestive system and abdomen: Secondary | ICD-10-CM | POA: Insufficient documentation

## 2021-01-23 DIAGNOSIS — R1904 Left lower quadrant abdominal swelling, mass and lump: Secondary | ICD-10-CM | POA: Diagnosis not present

## 2021-01-23 DIAGNOSIS — D259 Leiomyoma of uterus, unspecified: Secondary | ICD-10-CM | POA: Diagnosis not present

## 2021-01-23 DIAGNOSIS — N852 Hypertrophy of uterus: Secondary | ICD-10-CM | POA: Diagnosis not present

## 2021-02-01 ENCOUNTER — Other Ambulatory Visit: Payer: Self-pay | Admitting: Family Medicine

## 2021-02-02 ENCOUNTER — Other Ambulatory Visit (HOSPITAL_BASED_OUTPATIENT_CLINIC_OR_DEPARTMENT_OTHER): Payer: Self-pay

## 2021-02-02 MED ORDER — LEVONORGESTREL-ETHINYL ESTRAD 0.1-20 MG-MCG PO TABS
1.0000 | ORAL_TABLET | Freq: Every morning | ORAL | 1 refills | Status: DC
  Filled 2021-02-02: qty 84, 84d supply, fill #0
  Filled 2021-04-20: qty 84, 84d supply, fill #1

## 2021-02-07 ENCOUNTER — Ambulatory Visit (HOSPITAL_BASED_OUTPATIENT_CLINIC_OR_DEPARTMENT_OTHER)
Admission: RE | Admit: 2021-02-07 | Discharge: 2021-02-07 | Disposition: A | Discharge status: Home / Self Care | Payer: 59 | Source: Ambulatory Visit | Attending: Family Medicine | Admitting: Family Medicine

## 2021-02-07 ENCOUNTER — Other Ambulatory Visit: Payer: Self-pay

## 2021-02-07 DIAGNOSIS — N9489 Other specified conditions associated with female genital organs and menstrual cycle: Secondary | ICD-10-CM | POA: Diagnosis not present

## 2021-02-07 DIAGNOSIS — D252 Subserosal leiomyoma of uterus: Secondary | ICD-10-CM | POA: Diagnosis not present

## 2021-02-07 DIAGNOSIS — R1909 Other intra-abdominal and pelvic swelling, mass and lump: Secondary | ICD-10-CM | POA: Diagnosis not present

## 2021-02-07 DIAGNOSIS — N852 Hypertrophy of uterus: Secondary | ICD-10-CM | POA: Diagnosis not present

## 2021-02-07 MED ORDER — GADOBUTROL 1 MMOL/ML IV SOLN
7.0000 mL | Freq: Once | INTRAVENOUS | Status: AC | PRN
  Administered 2021-02-07: 7 mL via INTRAVENOUS

## 2021-02-09 ENCOUNTER — Other Ambulatory Visit (HOSPITAL_BASED_OUTPATIENT_CLINIC_OR_DEPARTMENT_OTHER): Payer: Self-pay

## 2021-03-04 DIAGNOSIS — D259 Leiomyoma of uterus, unspecified: Secondary | ICD-10-CM | POA: Insufficient documentation

## 2021-03-04 HISTORY — DX: Leiomyoma of uterus, unspecified: D25.9

## 2021-04-21 ENCOUNTER — Other Ambulatory Visit (HOSPITAL_BASED_OUTPATIENT_CLINIC_OR_DEPARTMENT_OTHER): Payer: Self-pay

## 2021-04-24 ENCOUNTER — Other Ambulatory Visit (HOSPITAL_BASED_OUTPATIENT_CLINIC_OR_DEPARTMENT_OTHER): Payer: Self-pay

## 2021-04-24 MED ORDER — IBUPROFEN 600 MG PO TABS
600.0000 mg | ORAL_TABLET | Freq: Four times a day (QID) | ORAL | 1 refills | Status: DC
  Filled 2021-04-24: qty 30, 8d supply, fill #0

## 2021-04-24 MED ORDER — OXYCODONE HCL 5 MG PO TABS
5.0000 mg | ORAL_TABLET | ORAL | 0 refills | Status: DC | PRN
  Filled 2021-04-24: qty 15, 3d supply, fill #0

## 2021-04-27 ENCOUNTER — Other Ambulatory Visit (HOSPITAL_BASED_OUTPATIENT_CLINIC_OR_DEPARTMENT_OTHER): Payer: Self-pay

## 2021-04-29 ENCOUNTER — Other Ambulatory Visit: Payer: Self-pay

## 2021-04-29 ENCOUNTER — Encounter (HOSPITAL_BASED_OUTPATIENT_CLINIC_OR_DEPARTMENT_OTHER): Payer: Self-pay | Admitting: Obstetrics and Gynecology

## 2021-04-29 NOTE — Progress Notes (Addendum)
Spoke w/ via phone for pre-op interview---pt Lab needs dos---- urine pregnancy POCT              Lab results------05/01/21 lab appt for CBC, type & screen, 10/15/20 Zio monitor and echocardiogram in Epic, 09/25/20 EKG in Epic and chart COVID test -----patient states asymptomatic no test needed Arrive at -------0645 on Tuesday, 05/05/21 NPO after MN NO Solid Food.  Clear liquids from MN until---0545 Med rec completed Medications to take morning of surgery -----Albuterol prn, Alesse Diabetic medication -----n/a Patient instructed no nail polish to be worn day of surgery Patient instructed to bring photo id and insurance card day of surgery Patient aware to have Driver (ride ) / caregiver    for 24 hours after surgery- mom Crystal Soto  Patient Special Instructions -----Bring inhaler. Extended recovery instructions given. Pre-Op special Istructions -----none Patient verbalized understanding of instructions that were given at this phone interview. Patient denies shortness of breath, chest pain, fever, cough at this phone interview.

## 2021-04-29 NOTE — Progress Notes (Signed)
PLEASE WEAR A MASK OUT IN PUBLIC AND SOCIAL DISTANCE AND [place] YOUR HANDS FREQUENTLY. PLEASE ASK ALL YOUR CLOSE HOUSEHOLD CONTACT TO WEAR MASK OUT IN PUBLIC AND SOCIAL DISTANCE AND [place] HANDS FREQUENTLY ALSO.      Your procedure is scheduled on Tuesday, May 05, 2021  Report to [facility] am.   Call this number if you have problems the morning of surgery  :[phone].   OUR ADDRESS IS [address].  WE ARE LOCATED IN [facility].  PLEASE BRING YOUR INSURANCE CARD AND PHOTO ID DAY OF SURGERY.  ONLY ONE PERSON ALLOWED IN FACILITY WAITING AREA.                                     REMEMBER:  DO NOT EAT FOOD, CANDY GUM OR MINTS  AFTER MIDNIGHT THE NIGHT BEFORE YOUR SURGERY . YOU MAY HAVE CLEAR LIQUIDS FROM MIDNIGHT THE NIGHT BEFORE YOUR SURGERY UNTIL  5:45 am. NO CLEAR LIQUIDS AFTER   5:45 am DAY OF SURGERY.   YOU MAY  BRUSH YOUR TEETH MORNING OF SURGERY AND RINSE YOUR MOUTH OUT, NO CHEWING GUM CANDY OR MINTS.    CLEAR LIQUID DIET   Foods Allowed                                                                     Foods Excluded  Coffee and tea, regular and decaf                             liquids that you cannot  Plain Jell-O any favor except red or purple                                           see through such as: Fruit ices (not with fruit pulp)                                     milk, soups, orange juice  Iced Popsicles                                    All solid food Carbonated beverages, regular and diet                                    Cranberry, grape and apple juices Sports drinks like Gatorade  Sample Menu Breakfast                                Lunch  Supper Cranberry juice                                           Jell-O                                     Grape juice                           Apple juice Coffee or tea                        Jell-O                                       Popsicle                                                Coffee or tea                        Coffee or tea  _____________________________________________________________________     TAKE THESE MEDICATIONS MORNING OF SURGERY WITH A SIP OF WATER:  Albuterol as needed (please bring), Alesse  ONE VISITOR IS ALLOWED IN WAITING ROOM ONLY DAY OF SURGERY.  YOU MAY HAVE ANOTHER PERSON SWITCH OUT WITH THE  1  VISITOR IN THE WAITING ROOM DAY OF SURGERY AND A MASK MUST BE WORN IN THE WAITING ROOM.    2 VISITORS  MAY VISIT IN THE EXTENDED RECOVERY ROOM UNTIL 800 PM ONLY 1 VISITOR AGE 26 AND OVER MAY SPEND THE NIGHT AND MUST BE IN EXTENDED RECOVERY ROOM NO LATER THAN 800 PM .    UP TO 2 CHILDREN AGE 26 TO 15 TO 15 MAY ALSO VISIT IN EXTENDED RECOV ERY ROOM ONLY UNTIL 800 PM AND MUST LEAVE BY 800 PM.   ALL PERSONS VISITING IN EXTENDED RECOVERY ROOM MUST WEAR A MASK.                                    DO NOT WEAR JEWERLY, MAKE UP. DO NOT WEAR LOTIONS, POWDERS, PERFUMES OR NAIL POLISH ON YOUR FINGERNAILS. TOENAIL POLISH IS OK TO WEAR. DO NOT SHAVE FOR 48 HOURS PRIOR TO DAY OF SURGERY. MEN MAY SHAVE FACE AND NECK. CONTACTS, GLASSES, OR DENTURES MAY NOT BE WORN TO SURGERY.                                    [facility] IS NOT RESPONSIBLE  FOR ANY BELONGINGS.                                                                    [facility]  [facility] - Preparing for Surgery Before surgery, you can play an important role.  Because skin is not sterile, your skin needs to be as free of germs as possible.  You can reduce the number of germs on your skin by washing with CHG (chlorahexidine gluconate) soap before surgery.  CHG is an antiseptic cleaner which kills germs and bonds with the skin to continue killing germs even after washing. Please DO NOT use if you have an allergy to CHG or antibacterial soaps.  If your skin becomes reddened/irritated stop using the CHG and inform your nurse when you arrive at  Short Stay. Do not shave (including legs and underarms) for at least 48 hours prior to the first CHG shower.  You may shave your face/neck. Please follow these instructions carefully:  1.  Shower with CHG Soap the night before surgery and the  morning of Surgery.  2.  If you choose to wash your hair, wash your hair first as usual with your  normal  shampoo.  3.  After you shampoo, rinse your hair and body thoroughly to remove the  shampoo.                            4.  Use CHG as you would any other liquid soap.  You can apply chg directly  to the skin and wash                      Gently with a scrungie or clean washcloth.  5.  Apply the CHG Soap to your body ONLY FROM THE NECK DOWN.   Do not use on face/ open                           Wound or open sores. Avoid contact with eyes, ears mouth and genitals (private parts).                       Wash face,  Genitals (private parts) with your normal soap.             6.  Wash thoroughly, paying special attention to the area where your surgery  will be performed.  7.  Thoroughly rinse your body with warm water from the neck down.  8.  DO NOT shower/wash with your normal soap after using and rinsing off  the CHG Soap.                9.  Pat yourself dry with a clean towel.            10.  Wear clean pajamas.            11.  Place clean sheets on your bed the night of your first shower and do not  sleep with pets. Day of Surgery : Do not apply any lotions/deodorants the morning of surgery.  Please wear clean clothes to the hospital/surgery center.  IF YOU HAVE ANY SKIN IRRITATION OR PROBLEMS WITH THE SURGICAL SOAP, PLEASE GET A BAR OF GOLD DIAL SOAP AND SHOWER THE NIGHT BEFORE YOUR SURGERY AND THE MORNING OF YOUR SURGERY. PLEASE LET THE NURSE KNOW MORNING OF YOUR SURGERY IF YOU HAD ANY PROBLEMS WITH THE SURGICAL SOAP.   ________________________________________________________________________  QUESTIONS [place] Falling  PRE OP NURSE PHONE [phone].

## 2021-05-01 ENCOUNTER — Other Ambulatory Visit: Payer: Self-pay

## 2021-05-01 ENCOUNTER — Encounter (HOSPITAL_COMMUNITY)
Admission: RE | Admit: 2021-05-01 | Discharge: 2021-05-01 | Disposition: A | Discharge status: Home / Self Care | Payer: 59 | Source: Ambulatory Visit | Attending: Obstetrics and Gynecology | Admitting: Obstetrics and Gynecology

## 2021-05-01 DIAGNOSIS — D251 Intramural leiomyoma of uterus: Secondary | ICD-10-CM | POA: Diagnosis not present

## 2021-05-01 DIAGNOSIS — D252 Subserosal leiomyoma of uterus: Secondary | ICD-10-CM | POA: Insufficient documentation

## 2021-05-01 DIAGNOSIS — Z01812 Encounter for preprocedural laboratory examination: Secondary | ICD-10-CM | POA: Diagnosis not present

## 2021-05-01 LAB — CBC
HCT: 41.5 % (ref 36.0–46.0)
Hemoglobin: 13.7 g/dL (ref 12.0–15.0)
MCH: 29.3 pg (ref 26.0–34.0)
MCHC: 33 g/dL (ref 30.0–36.0)
MCV: 88.9 fL (ref 80.0–100.0)
Platelets: 249 10*3/uL (ref 150–400)
RBC: 4.67 MIL/uL (ref 3.87–5.11)
RDW: 12.2 % (ref 11.5–15.5)
WBC: 5.6 10*3/uL (ref 4.0–10.5)
nRBC: 0 % (ref 0.0–0.2)

## 2021-05-01 NOTE — H&P (Signed)
Crystal Soto is an 26 y.o. female G5 who presents for a scheduled myomectomy of a large 13cm pedunculated fibroid.  Pt had noted symptoms of pressure and frequency and was found to have a pelvic mass.  Subsequent MRI demonstrated a 13+cm pedunculated fibroid arising from fundus and pressing anteriorly.  She also had a smaller 3.3 fibroid posteriorly. Her menses are controlled on OCP's.  She wishes to conceive in next year.   Pertinent Gynecological History: On OCP's with regular menses  Menstrual History:  Patient's last menstrual period was 04/18/2021 (exact date).    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 Right breast.  Follow by Dr. Duke Salvia.  Stable.  No intervention.   Heart murmur 09/25/2020   Hypercholesteremia 06/03/2016   Mucocele of lower lip 12/17/2016   Palpitations 08/08/2020   Per 04/29/21 phone call with pt, she was having heart palpitations and PVCs. She said that she reduced her caffeine intake and it is much better now. 10/15/20 EKG showed NSR w/ sinus arrythmia, 10/15/20 Echocardiogram LVEF 55-60%, 10/15/20 Zio monitor all in Epic. LOV with cardiology, Dr. Lennox Pippins on 09/25/20 in [place].   Seasonal allergies    Uterine leiomyoma 03/04/2021   Wears contact lenses    Wears glasses     Past Surgical History:  Procedure Laterality Date   ORAL MUCOCELE EXCISION  2018   WISDOM TOOTH EXTRACTION     as a teenager    Family History  Problem Relation Age of Onset   High Cholesterol Mother    Cancer Maternal Grandmother    High Cholesterol Maternal Grandmother    Hypertension Maternal Grandfather    High Cholesterol Maternal Grandfather    Heart disease Paternal Grandfather     Social History:  reports that she has never smoked. She has  never used smokeless tobacco. She reports current alcohol use of about 2.0 standard drinks per week. She reports that she does not use drugs.  Allergies: No Known Allergies  No medications prior to admission.    Review of Systems  Constitutional:  Negative for fever.  Gastrointestinal:  Negative for abdominal pain.  Genitourinary:  Positive for frequency. Negative for vaginal pain.   Weight 68 kg, last menstrual period 04/18/2021. Physical Exam Cardiovascular:     Rate and Rhythm: Normal rate and regular rhythm.  Pulmonary:     Effort: Pulmonary effort is normal.  Abdominal:     Palpations: Abdomen is soft.  Neurological:     Mental Status: She is alert.  Psychiatric:        Mood and Affect: Mood normal.  Pelvic: Uterus: midline, no uterine prolapse, mobile, enlarged 10 wks, contour irregular Bladder/Urethra: normal meatus Adnexa/Parametria no tenderness (left adnexal mass felt in LLQ c/w 13 cm fibroid noted on MRI)   Results for orders placed or performed during the hospital encounter of 05/01/21 (from the past 24 hour(s))  Type and screen  SUR[facility]ERY [facility]ENTER     Status: None   Collection Time: 05/01/21 10:56 AM  Result Value Ref Range   ABO/RH(D) A POS    Antibody Screen NEG    Sample Expiration 05/15/2021,2359    Extend sample reason      NO TRANSFUSIONS OR PREGNANCY IN THE PAST 3 MONTHS  Performed at [facility], [place] [address] [place], [place] [address]   CBC     Status: None   Collection Time: 05/01/21 10:56 AM  Result Value Ref Range   WBC 5.6 4.0 - 10.5 K/uL   RBC 4.67 3.87 - 5.11 MIL/uL   Hemoglobin 13.7 12.0 - 15.0 g/dL   HCT 41.5 36.0 - 46.0 %   MCV 88.9 80.0 - 100.0 fL   MCH 29.3 26.0 - 34.0 pg   MCHC 33.0 30.0 - 36.0 g/dL   RDW 12.2 11.5 - 15.5 %   Platelets 249 150 - 400 K/uL   nRBC 0.0 0.0 - 0.2 %    No results found.  Assessment/Plan: d/w pt myomectomy procedure in detail. We discussed beginning laparoscopically and  if visibility poor or if bleeding too much would convert to minilap to complete and remove the fibroid. We discussed the risks of bleeding, infection and possible damage to bowel and bladder. Pt agreeable to either laparoscopic or open approach.  I reviewed the MRI with radiology and fibroid stalk appears to be 3-4cm with no large vessels, so reasonable to attempt laparoscopy.      Logan Bores 05/01/2021, 6:53 PM

## 2021-05-05 ENCOUNTER — Encounter (HOSPITAL_BASED_OUTPATIENT_CLINIC_OR_DEPARTMENT_OTHER)
Admission: RE | Disposition: A | Discharge status: Home / Self Care | Payer: Self-pay | Source: Ambulatory Visit | Attending: Obstetrics and Gynecology

## 2021-05-05 ENCOUNTER — Encounter (HOSPITAL_BASED_OUTPATIENT_CLINIC_OR_DEPARTMENT_OTHER): Payer: Self-pay | Admitting: Obstetrics and Gynecology

## 2021-05-05 ENCOUNTER — Ambulatory Visit (HOSPITAL_BASED_OUTPATIENT_CLINIC_OR_DEPARTMENT_OTHER)
Admission: RE | Admit: 2021-05-05 | Discharge: 2021-05-05 | Disposition: A | Discharge status: Home / Self Care | Payer: 59 | Source: Ambulatory Visit | Attending: Obstetrics and Gynecology | Admitting: Obstetrics and Gynecology

## 2021-05-05 ENCOUNTER — Other Ambulatory Visit: Payer: Self-pay

## 2021-05-05 ENCOUNTER — Ambulatory Visit (HOSPITAL_BASED_OUTPATIENT_CLINIC_OR_DEPARTMENT_OTHER): Payer: 59 | Admitting: Anesthesiology

## 2021-05-05 DIAGNOSIS — D259 Leiomyoma of uterus, unspecified: Secondary | ICD-10-CM

## 2021-05-05 DIAGNOSIS — D251 Intramural leiomyoma of uterus: Secondary | ICD-10-CM

## 2021-05-05 DIAGNOSIS — D252 Subserosal leiomyoma of uterus: Secondary | ICD-10-CM

## 2021-05-05 HISTORY — DX: Leiomyoma of uterus, unspecified: D25.9

## 2021-05-05 HISTORY — DX: Presence of spectacles and contact lenses: Z97.3

## 2021-05-05 LAB — TYPE AND SCREEN
ABO/RH(D): A POS
Antibody Screen: NEGATIVE

## 2021-05-05 LAB — POCT PREGNANCY, URINE: Preg Test, Ur: NEGATIVE

## 2021-05-05 LAB — ABO/RH: ABO/RH(D): A POS

## 2021-05-05 SURGERY — MYOMECTOMY, LAPAROSCOPIC
Anesthesia: General | Site: Abdomen

## 2021-05-05 MED ORDER — FENTANYL CITRATE (PF) 100 MCG/2ML IJ SOLN
INTRAMUSCULAR | Status: DC | PRN
Start: 2021-05-05 — End: 2021-05-05
  Administered 2021-05-05: 100 ug via INTRAVENOUS
  Administered 2021-05-05 (×4): 50 ug via INTRAVENOUS

## 2021-05-05 MED ORDER — PROPOFOL 10 MG/ML IV BOLUS
INTRAVENOUS | Status: AC
  Filled 2021-05-05: qty 20

## 2021-05-05 MED ORDER — HYDROMORPHONE HCL 2 MG/ML IJ SOLN
INTRAMUSCULAR | Status: AC
  Filled 2021-05-05: qty 1

## 2021-05-05 MED ORDER — POVIDONE-IODINE 10 % EX SWAB
2.0000 "application " | Freq: Once | CUTANEOUS | Status: AC
  Administered 2021-05-05: 2 via TOPICAL

## 2021-05-05 MED ORDER — CEFAZOLIN SODIUM-DEXTROSE 2-4 GM/100ML-% IV SOLN
2.0000 g | INTRAVENOUS | Status: AC
  Administered 2021-05-05: 2 g via INTRAVENOUS

## 2021-05-05 MED ORDER — FENTANYL CITRATE (PF) 100 MCG/2ML IJ SOLN: 25.0000 ug | INTRAMUSCULAR | Status: DC | PRN

## 2021-05-05 MED ORDER — FENTANYL CITRATE (PF) 100 MCG/2ML IJ SOLN
INTRAMUSCULAR | Status: AC
  Filled 2021-05-05: qty 2

## 2021-05-05 MED ORDER — SODIUM CHLORIDE 0.9 % IR SOLN
Status: DC | PRN
  Administered 2021-05-05 (×2): 1000 mL

## 2021-05-05 MED ORDER — POLYMYXIN B-TRIMETHOPRIM 10000-0.1 UNIT/ML-% OP SOLN
1.0000 [drp] | Freq: Three times a day (TID) | OPHTHALMIC | Status: DC
  Administered 2021-05-05: 1 [drp] via OPHTHALMIC
  Filled 2021-05-05: qty 10

## 2021-05-05 MED ORDER — EPHEDRINE SULFATE (PRESSORS) 50 MG/ML IJ SOLN
INTRAMUSCULAR | Status: DC | PRN
  Administered 2021-05-05: 10 mg via INTRAVENOUS

## 2021-05-05 MED ORDER — DEXAMETHASONE SODIUM PHOSPHATE 4 MG/ML IJ SOLN
INTRAMUSCULAR | Status: DC | PRN
  Administered 2021-05-05: 10 mg via INTRAVENOUS

## 2021-05-05 MED ORDER — CEFAZOLIN SODIUM-DEXTROSE 2-4 GM/100ML-% IV SOLN
INTRAVENOUS | Status: AC
  Filled 2021-05-05: qty 100

## 2021-05-05 MED ORDER — ROCURONIUM BROMIDE 10 MG/ML (PF) SYRINGE
PREFILLED_SYRINGE | INTRAVENOUS | Status: AC
  Filled 2021-05-05: qty 10

## 2021-05-05 MED ORDER — HEMOSTATIC AGENTS (NO CHARGE) OPTIME
TOPICAL | Status: DC | PRN
  Administered 2021-05-05: 1 via TOPICAL

## 2021-05-05 MED ORDER — ONDANSETRON HCL 4 MG/2ML IJ SOLN
INTRAMUSCULAR | Status: DC | PRN
Start: 2021-05-05 — End: 2021-05-05
  Administered 2021-05-05: 4 mg via INTRAVENOUS

## 2021-05-05 MED ORDER — HEMOSTATIC AGENTS (NO CHARGE) OPTIME
TOPICAL | Status: DC | PRN
  Administered 2021-05-05 (×3): 1

## 2021-05-05 MED ORDER — OXYCODONE HCL 5 MG/5ML PO SOLN: 5.0000 mg | Freq: Once | ORAL | Status: AC | PRN

## 2021-05-05 MED ORDER — 0.9 % SODIUM CHLORIDE (POUR BTL) OPTIME
TOPICAL | Status: DC | PRN
  Administered 2021-05-05: 1000 mL

## 2021-05-05 MED ORDER — BUPIVACAINE HCL (PF) 0.25 % IJ SOLN
INTRAMUSCULAR | Status: DC | PRN
  Administered 2021-05-05: 19 mL

## 2021-05-05 MED ORDER — PROPOFOL 10 MG/ML IV BOLUS
INTRAVENOUS | Status: DC | PRN
  Administered 2021-05-05: 200 mg via INTRAVENOUS
  Administered 2021-05-05: 50 mg via INTRAVENOUS

## 2021-05-05 MED ORDER — LIDOCAINE HCL (CARDIAC) PF 100 MG/5ML IV SOSY
PREFILLED_SYRINGE | INTRAVENOUS | Status: DC | PRN
  Administered 2021-05-05: 100 mg via INTRAVENOUS

## 2021-05-05 MED ORDER — ONDANSETRON HCL 4 MG/2ML IJ SOLN
INTRAMUSCULAR | Status: AC
  Filled 2021-05-05: qty 2

## 2021-05-05 MED ORDER — ACETAMINOPHEN 325 MG PO TABS: 325.0000 mg | ORAL_TABLET | ORAL | Status: DC | PRN

## 2021-05-05 MED ORDER — ACETAMINOPHEN 160 MG/5ML PO SOLN: 325.0000 mg | ORAL | Status: DC | PRN

## 2021-05-05 MED ORDER — SUGAMMADEX SODIUM 200 MG/2ML IV SOLN
INTRAVENOUS | Status: DC | PRN
  Administered 2021-05-05: 200 mg via INTRAVENOUS

## 2021-05-05 MED ORDER — LACTATED RINGERS IV SOLN: INTRAVENOUS | Status: DC

## 2021-05-05 MED ORDER — ROCURONIUM BROMIDE 100 MG/10ML IV SOLN
INTRAVENOUS | Status: DC | PRN
  Administered 2021-05-05: 20 mg via INTRAVENOUS
  Administered 2021-05-05: 60 mg via INTRAVENOUS
  Administered 2021-05-05: 20 mg via INTRAVENOUS
  Administered 2021-05-05: 5 mg via INTRAVENOUS
  Administered 2021-05-05: 20 mg via INTRAVENOUS
  Administered 2021-05-05: 10 mg via INTRAVENOUS
  Administered 2021-05-05: 20 mg via INTRAVENOUS

## 2021-05-05 MED ORDER — OXYCODONE HCL 5 MG PO TABS
5.0000 mg | ORAL_TABLET | Freq: Once | ORAL | Status: AC | PRN
  Administered 2021-05-05: 5 mg via ORAL

## 2021-05-05 MED ORDER — HYDROMORPHONE HCL 1 MG/ML IJ SOLN
INTRAMUSCULAR | Status: DC | PRN
  Administered 2021-05-05: 1 mg via INTRAVENOUS
  Administered 2021-05-05 (×2): .5 mg via INTRAVENOUS

## 2021-05-05 MED ORDER — MIDAZOLAM HCL 2 MG/2ML IJ SOLN
INTRAMUSCULAR | Status: AC
  Filled 2021-05-05: qty 2

## 2021-05-05 MED ORDER — BSS IO SOLN
15.0000 mL | Freq: Once | INTRAOCULAR | Status: AC
  Administered 2021-05-05: 15 mL
  Filled 2021-05-05: qty 15

## 2021-05-05 MED ORDER — DEXAMETHASONE SODIUM PHOSPHATE 10 MG/ML IJ SOLN
INTRAMUSCULAR | Status: AC
  Filled 2021-05-05: qty 1

## 2021-05-05 MED ORDER — MIDAZOLAM HCL 5 MG/5ML IJ SOLN
INTRAMUSCULAR | Status: DC | PRN
  Administered 2021-05-05: 2 mg via INTRAVENOUS

## 2021-05-05 MED ORDER — OXYCODONE HCL 5 MG PO TABS
ORAL_TABLET | ORAL | Status: AC
  Filled 2021-05-05: qty 1

## 2021-05-05 MED ORDER — MEPERIDINE HCL 25 MG/ML IJ SOLN: 6.2500 mg | INTRAMUSCULAR | Status: DC | PRN

## 2021-05-05 MED ORDER — ONDANSETRON HCL 4 MG/2ML IJ SOLN: 4.0000 mg | Freq: Once | INTRAMUSCULAR | Status: DC | PRN

## 2021-05-05 MED ORDER — EPHEDRINE 5 MG/ML INJ
INTRAVENOUS | Status: AC
  Filled 2021-05-05: qty 5

## 2021-05-05 MED ORDER — ACETAMINOPHEN 10 MG/ML IV SOLN
INTRAVENOUS | Status: DC | PRN
Start: 2021-05-05 — End: 2021-05-05
  Administered 2021-05-05: 1000 mg via INTRAVENOUS

## 2021-05-05 MED ORDER — LIDOCAINE HCL (PF) 2 % IJ SOLN
INTRAMUSCULAR | Status: AC
  Filled 2021-05-05: qty 5

## 2021-05-05 MED ORDER — KETOROLAC TROMETHAMINE 0.5 % OP SOLN
1.0000 [drp] | Freq: Three times a day (TID) | OPHTHALMIC | Status: DC | PRN
  Administered 2021-05-05: 1 [drp] via OPHTHALMIC
  Filled 2021-05-05: qty 3

## 2021-05-05 MED ORDER — KETOROLAC TROMETHAMINE 30 MG/ML IJ SOLN
INTRAMUSCULAR | Status: AC
  Filled 2021-05-05: qty 1

## 2021-05-05 SURGICAL SUPPLY — 44 items
ADH SKN CLS APL DERMABOND .7 (GAUZE/BANDAGES/DRESSINGS) ×2
APL ESCP 73.6OZ SRGCL (TIP) ×2
APL SRG 38 LTWT LNG FL B (MISCELLANEOUS) ×4
APPLICATOR ARISTA FLEXITIP XL (MISCELLANEOUS) ×4 IMPLANT
BLADE SURG 10 STRL SS (BLADE) ×8 IMPLANT
CABLE HIGH FREQUENCY MONO STRZ (ELECTRODE) ×2 IMPLANT
CNTNR URN SCR LID CUP LEK RST (MISCELLANEOUS) ×2 IMPLANT
CONT SPEC 4OZ STRL OR WHT (MISCELLANEOUS) ×3
DERMABOND ADVANCED (GAUZE/BANDAGES/DRESSINGS) ×1
DERMABOND ADVANCED .7 DNX12 (GAUZE/BANDAGES/DRESSINGS) ×2 IMPLANT
DRSG OPSITE POSTOP 4X10 (GAUZE/BANDAGES/DRESSINGS) ×3 IMPLANT
DURAPREP 26ML APPLICATOR (WOUND CARE) ×3 IMPLANT
EXTRT SYSTEM ALEXIS 17CM (MISCELLANEOUS) ×3
GAUZE 4X4 16PLY ~~LOC~~+RFID DBL (SPONGE) ×6 IMPLANT
GLOVE SURG ENC MOIS LTX SZ6.5 (GLOVE) ×6 IMPLANT
GLOVE SURG NEOPR MICRO LF SZ6 (GLOVE) ×4 IMPLANT
GLOVE SURG UNDER POLY LF SZ6 (GLOVE) ×4 IMPLANT
GLOVE SURG UNDER POLY LF SZ7 (GLOVE) ×4 IMPLANT
GOWN STRL REUS W/TWL LRG LVL3 (GOWN DISPOSABLE) ×10 IMPLANT
GOWN STRL REUS W/TWL XL LVL3 (GOWN DISPOSABLE) ×2 IMPLANT
HEMOSTAT ARISTA ABSORB 3G PWDR (HEMOSTASIS) ×6 IMPLANT
IV NS 1000ML (IV SOLUTION) ×6
IV NS 1000ML BAXH (IV SOLUTION) ×2 IMPLANT
KIT TURNOVER CYSTO (KITS) ×3 IMPLANT
NEEDLE INSUFFLATION 120MM (ENDOMECHANICALS) ×2 IMPLANT
NS IRRIG 1000ML POUR BTL (IV SOLUTION) ×3 IMPLANT
PACK LAPAROSCOPY BASIN (CUSTOM PROCEDURE TRAY) ×3 IMPLANT
PACK TRENDGUARD 450 HYBRID PRO (MISCELLANEOUS) ×2 IMPLANT
PAD OB MATERNITY 4.3X12.25 (PERSONAL CARE ITEMS) ×3 IMPLANT
POWDER SURGICEL 3.0 GRAM (HEMOSTASIS) ×2 IMPLANT
PROTECTOR NERVE ULNAR (MISCELLANEOUS) ×6 IMPLANT
SET SUCTION IRRIG HYDROSURG (IRRIGATION / IRRIGATOR) ×2 IMPLANT
SET TUBE SMOKE EVAC HIGH FLOW (TUBING) ×3 IMPLANT
SHEARS HARMONIC ACE PLUS 36CM (ENDOMECHANICALS) ×2 IMPLANT
SUT VIC AB 4-0 PS2 18 (SUTURE) ×3 IMPLANT
SUT VICRYL 0 UR6 27IN ABS (SUTURE) ×4 IMPLANT
SYR CONTROL 10ML LL (SYRINGE) ×2 IMPLANT
SYSTEM CONTND EXTRCTN KII BLLN (MISCELLANEOUS) ×1 IMPLANT
TIP ENDOSCOPIC SURGICEL (TIP) ×2 IMPLANT
TOWEL OR 17X26 10 PK STRL BLUE (TOWEL DISPOSABLE) ×4 IMPLANT
TRAY FOLEY W/BAG SLVR 14FR LF (SET/KITS/TRAYS/PACK) ×3 IMPLANT
TRENDGUARD 450 HYBRID PRO PACK (MISCELLANEOUS) ×3
TROCAR BLADELESS OPT 5 100 (ENDOMECHANICALS) ×4 IMPLANT
WARMER LAPAROSCOPE (MISCELLANEOUS) ×3 IMPLANT

## 2021-05-05 NOTE — Op Note (Signed)
Operative Note  Dr. Meisinger's assistance was necessary for visualization and completing the case  Preoperative Diagnosis Bulky fibroid uterus with dominant fibroid a 13cm pedunculated anterior fundal fibroid  Postoperative Diagnosis same  Procedure Laparoscopic myomectomy with morcellation of large fibroid  Surgeon Paula Compton, MD Cheri Fowler, MD  Anesthesia GETA  Fluids: EBL 525mL UOP 657ml clear IVF 2438mL LR  Findings There was a large pedunculated fibroid coming off the top of the uterine fundus on a 3-4cm stalk.  It was 13cm and filled the pelvis, but was mobile and able to be retracted anterior.  There were two other smaller fibroids 1-2cm on the posterior fundus and one other fibroid that was posterior in the LUS and intramural.  Specimen Morcellated fibroids weighing 400+ grams  Procedure Note  Patient was taken to the operating room where general anesthesia was obtained without difficulty she was prepped and draped in the normal sterile fashion in the dorsal lithotomy position. An appropriate time out was performed. A Hulka tenaculum was placed within cervix and a Foley catheter in the bladder. Attention was then turned to the abdomen where of infraumbilical incision was made after injection with quarter percent Marcaine approximate 2 cm in width. The fascia was then identified and grasped with Coker clamps and elevated. The fascia was then opened sharply with Mayo scissors and intraperitoneal placement of the Sheryle Hail was performed, after a pursestring suture of 0 Vicryl was placed around the fascial edge. With the [facility] in place the pneumoperitoneum was obtained and the pelvis and abdomen inspected with findings as previously stated. Two additional 5 mm ports were placed under direct visualization from the lateral upper quadrants. Each port site was injected with quarter percent Marcaine prior placement.    THe large fibroid was noted to be extending off the  anterior left fundus and a tenaculum was used to grasp and lift the fibroid exposing a 3-4 cm stalk just medial and above the left round ligament extending laterally across the fundus.  The harmonic scalpel was used to come across the stalk and completely amputate the fibroid from its stalk.  The base had some areas of brisk bleeding that was able to be controlled with the harmonic scalpel until only slight oozing was noted.  Two smaller fibroids posteriorly were removed with the harmonic scalpel and were on small stalks.  These were placed in the anterior cul de sac.   The [facility] trocar was then removed and the Alexis 17cm bag was placed in the umbilical port.  THe Sheryle Hail was then replaced and the bag open in the pelvis.  The two small fibroids were retrieved from the anterior cul-de-sac and then the large fibroid placed within it as well.  The Sheryle Hail was then removed and the bag pulled up through the umbilicus and secured with the morcellation collar in place.   The fibroid was then morcellated over more than an hour with the scalpel by carefully lifting it up through the collar.  Once the fibroid was morcellated and removed the bag was removed intact.   The Sheryle Hail was replaced.  The uterus and pelvis were irrigated and about 430ml of blood and clot removed.  There was steady oozing from the uterine fundus and this was cauterized with the harmonic as much as possible and then Arista applied.  This was observed for several minutes and there was a steady drip in the pelvis which continued to slowly accumulate.  The harmonic scalpel and monopolar spatula were used to slow  the oozing and surgicel powder as well.  Eventually there was no further dripping and one more round of arista applied to the uterus.  No further accumulation was noted with the pneumoperitoneum reduced.  The Sheryle Hail was then removed and the pursestring of the umbilical port was then closed.  The fascia was lifted and a small defect laterally  closed where the incision had been stretched to accommodate the fibroid. No defect was noted at this point and the subcutaneous tissue was closed with 0-vicryl and the skin closed with 4-0 vicryl in a subcuticular stitch.    The 5 mm ports were then removed after the pneumoperitoneum was reduced.  All skin incisions were closed with 4-0 vicryl in a subcuticular stitch and dermabond.  The hulka tenaculum was removed from the cervix. All instrument and sponge counts were correct and the patient was awakened and taken to recovery in good condition.

## 2021-05-05 NOTE — Progress Notes (Signed)
Patient complaining of left eye pain, abdominal soreness, and left finger numbness (thumb, pointer finger, and middle finger). RN notified Dr. Ambrose Pancoast to evaluate patient. Dr. Ambrose Pancoast gave patient two eye drops and saline to take home to alternate every 4 hours along with saline. Patient also received an eye patch that should be removed every 4 hours. Patient informed to contact eye doctor tomorrow if she does not have any relief. Patients fingers were evaluated and Dr. Ambrose Pancoast stated that it could be from medication that she was given, but the feeling should return. Patient was given 5mg  of oxycodone IR for pain. Patient to be in observation for 30 minutes after being given medication.

## 2021-05-05 NOTE — Interval H&P Note (Signed)
History and Physical Interval Note:  05/05/2021 8:18 AM  Crystal Soto  has presented today for surgery, with the diagnosis of uterine fibroids.  The various methods of treatment have been discussed with the patient and family. After consideration of risks, benefits and other options for treatment, the patient has consented to  Procedure(s) with comments: LAPAROSCOPIC MYOMECTOMY (N/A) - harmonic scalpel, hassan port, two 17mm ports, large endobag and morcellation collar ABDOMINAL MYOMECTOMY (N/A) as a surgical intervention.  The patient's history has been reviewed, patient examined, no change in status, stable for surgery.  I have reviewed the patient's chart and labs.  Questions were answered to the patient's satisfaction.     Logan Bores

## 2021-05-05 NOTE — Discharge Instructions (Addendum)
°  Post Anesthesia Home Care Instructions  Activity: Get plenty of rest for the remainder of the day. A responsible individual must stay with you for 24 hours following the procedure.  For the next 24 hours, DO NOT: -Drive a car -Paediatric nurse -Drink alcoholic beverages -Take any medication unless instructed by your physician -Make any legal decisions or sign important papers.  Meals: Start with liquid foods such as gelatin or soup. Progress to regular foods as tolerated. Avoid greasy, spicy, heavy foods. If nausea and/or vomiting occur, drink only clear liquids until the nausea and/or vomiting subsides. Call your physician if vomiting continues.  Special Instructions/Symptoms: Your throat may feel dry or sore from the anesthesia or the breathing tube placed in your throat during surgery. If this causes discomfort, gargle with warm salt water. The discomfort should disappear within 24 hours.  HOME CARE INSTRUCTIONS - LAPAROSCOPY  Wound Care: The bandaids or dressing which are placed over the skin openings may be removed the day after surgery. The incision should be kept clean and dry. The stitches do not need to be removed. Should the incision become sore, red, and swollen after the first week, check with your doctor.  Personal Hygiene: Shower the day after your procedure. Always wipe from front to back after elimination.   Activity: Do not drive or operate any equipment today. The effects of the anesthesia are still present and drowsiness may result. Rest today, not necessarily flat bed rest, just take it easy. You may resume your normal activity in one to three days or as instructed by your physician.  Sexual Activity: You resume sexual activity as indicated by your physician. If your laparoscopy was for a sterilization ( tubes tied ), continue current method of birth control until after your next period or ask for specific instructions from your doctor.  Diet: Eat a light diet as  desired this evening. You may resume a regular diet tomorrow.  Return to Work: Two to three days or as indicated by your doctor.  Expectations After Surgery: Your surgery will cause vaginal drainage or spotting which may continue for 2-3 days. Mild abdominal discomfort or tenderness is not unusual and some shoulder pain may also be noted which can be relieved by lying flat in pain.  Call Your Doctor If these Occur:  Persistent or heavy bleeding at incision site       Redness or swelling around incision       Elevation of temperature greater than 100 degrees F  May take tylenol as needed  for pain/cramping beginning at 6 PM.  Use balanced salt solution eye drops as needed to left eye. Acular (ketorolac) eye drops 1 drop every eight hours to left eye. Dose given at 1411. Next dose at 10 PM. Polymixin (trimethoprim-polymixin B) eye drops 1 drop every eight hours to left eye. Dose given at 1422. Next dose at 10 PM.  Instill eye drops 5 minutes apart. Call your eye doctor tomorrow if no improvement.

## 2021-05-05 NOTE — Anesthesia Postprocedure Evaluation (Signed)
Anesthesia Post Note  Patient: Crystal Soto  Procedure(s) Performed: LAPAROSCOPIC MYOMECTOMY (Abdomen)     Patient location during evaluation: PACU Anesthesia Type: General Level of consciousness: awake and alert Pain management: pain level controlled Vital Signs Assessment: post-procedure vital signs reviewed and stable Respiratory status: spontaneous breathing, nonlabored ventilation, respiratory function stable and patient connected to nasal cannula oxygen Cardiovascular status: blood pressure returned to baseline and stable Postop Assessment: no apparent nausea or vomiting Anesthetic complications: yes (Left eye pain and itchyness with no redness.  Instituted corneal abrasion orders and protocol . )   No notable events documented.  Last Vitals:  Vitals:   05/05/21 1400 05/05/21 1415  BP: 124/79 125/79  Pulse: 90 92  Resp: 14 (!) 22  Temp:    SpO2: 98% 97%    Last Pain:  Vitals:   05/05/21 1313  TempSrc:   PainSc: 0-No pain                 Lesly Joslyn

## 2021-05-05 NOTE — Transfer of Care (Signed)
Immediate Anesthesia Transfer of Care Note  Patient: Crystal Soto  Procedure(s) Performed: Procedure(s) (LRB): LAPAROSCOPIC MYOMECTOMY (N/A)  Patient Location: PACU  Anesthesia Type: General  Level of Consciousness: awake, sedated, patient cooperative and responds to stimulation  Airway & Oxygen Therapy: Patient Spontanous Breathing and Patient connected to [place] 02 and soft FM   Post-op Assessment: Report given to PACU RN, Post -op Vital signs reviewed and stable and Patient moving all extremities  Post vital signs: Reviewed and stable  Complications: No apparent anesthesia complications

## 2021-05-05 NOTE — Progress Notes (Signed)
Patient complaining of left arm numbess and tingling in fingers. Patient evaluated by Dr. Ambrose Pancoast and given option to be admitted. Patient declined and wants to go home. Dr. Ambrose Pancoast will call patient to follow up this evening.

## 2021-05-05 NOTE — Progress Notes (Signed)
C/O left eye pain, "feels like something is in my eye". Dr. Eligha Bridegroom aware and in to see. Left eye red and slightly swollen. Sclera red. Cool compress applied. New orders form Dr. Eligha Bridegroom.

## 2021-05-05 NOTE — Anesthesia Procedure Notes (Signed)
Procedure Name: Intubation Date/Time: 05/05/2021 8:42 AM Performed by: Justice Rocher, CRNA Pre-anesthesia Checklist: Patient identified, Emergency Drugs available, Suction available, Patient being monitored and Timeout performed Patient Re-evaluated:Patient Re-evaluated prior to induction Oxygen Delivery Method: Circle system utilized Preoxygenation: Pre-oxygenation with 100% oxygen Induction Type: IV induction Ventilation: Mask ventilation without difficulty Laryngoscope Size: Mac and 3 Grade View: Grade II Tube type: Oral Tube size: 7.0 mm Number of attempts: 1 Airway Equipment and Method: Stylet and Oral airway Placement Confirmation: ETT inserted through vocal cords under direct vision, positive ETCO2, breath sounds checked- equal and bilateral and CO2 detector Secured at: 23 cm Tube secured with: Tape Dental Injury: Teeth and Oropharynx as per pre-operative assessment

## 2021-05-05 NOTE — Anesthesia Preprocedure Evaluation (Addendum)
Anesthesia Evaluation  Patient identified by MRN, date of birth, ID band Patient awake    Reviewed: Allergy & Precautions, H&P , NPO status , Patient's Chart, lab work & pertinent test results, reviewed documented beta blocker date and time   Airway Mallampati: I  TM Distance: >3 FB Neck ROM: full    Dental no notable dental hx. (+) Teeth Intact, Dental Advisory Given,    Pulmonary asthma ,    Pulmonary exam normal breath sounds clear to auscultation       Cardiovascular Exercise Tolerance: Good negative cardio ROS   Rhythm:regular Rate:Normal     Neuro/Psych negative neurological ROS  negative psych ROS   GI/Hepatic negative GI ROS, Neg liver ROS,   Endo/Other  negative endocrine ROS  Renal/GU negative Renal ROS  negative genitourinary   Musculoskeletal   Abdominal   Peds  Hematology negative hematology ROS (+)   Anesthesia Other Findings   Reproductive/Obstetrics negative OB ROS                            Anesthesia Physical Anesthesia Plan  ASA: 2  Anesthesia Plan: General   Post-op Pain Management:    Induction: Intravenous  PONV Risk Score and Plan: 3 and Ondansetron, Dexamethasone and Treatment may vary due to age or medical condition  Airway Management Planned: Oral ETT  Additional Equipment: None  Intra-op Plan:   Post-operative Plan: Extubation in OR  Informed Consent: I have reviewed the patients History and Physical, chart, labs and discussed the procedure including the risks, benefits and alternatives for the proposed anesthesia with the patient or authorized representative who has indicated his/her understanding and acceptance.     Dental Advisory Given  Plan Discussed with: CRNA and Anesthesiologist  Anesthesia Plan Comments: (  )        Anesthesia Quick Evaluation

## 2021-05-06 LAB — SURGICAL PATHOLOGY

## 2021-05-28 ENCOUNTER — Other Ambulatory Visit (HOSPITAL_BASED_OUTPATIENT_CLINIC_OR_DEPARTMENT_OTHER): Payer: Self-pay

## 2021-05-28 MED ORDER — CEPHALEXIN 500 MG PO CAPS
500.0000 mg | ORAL_CAPSULE | Freq: Three times a day (TID) | ORAL | 0 refills | Status: DC
  Filled 2021-05-28: qty 21, 7d supply, fill #0

## 2021-06-23 ENCOUNTER — Other Ambulatory Visit: Payer: Self-pay

## 2021-06-23 DIAGNOSIS — Z973 Presence of spectacles and contact lenses: Secondary | ICD-10-CM | POA: Insufficient documentation

## 2021-06-24 ENCOUNTER — Encounter: Payer: Self-pay | Admitting: Cardiology

## 2021-06-24 ENCOUNTER — Ambulatory Visit (INDEPENDENT_AMBULATORY_CARE_PROVIDER_SITE_OTHER): Payer: 59 | Admitting: Cardiology

## 2021-06-24 ENCOUNTER — Other Ambulatory Visit: Payer: Self-pay

## 2021-06-24 VITALS — BP 132/72 | HR 58 | Ht 64.0 in | Wt 154.0 lb

## 2021-06-24 DIAGNOSIS — E7800 Pure hypercholesterolemia, unspecified: Secondary | ICD-10-CM | POA: Diagnosis not present

## 2021-06-24 DIAGNOSIS — R002 Palpitations: Secondary | ICD-10-CM | POA: Diagnosis not present

## 2021-06-24 NOTE — Progress Notes (Signed)
?Cardiology Office Note:   ? ?Date:  06/24/2021  ? ?ID:  Crystal Soto, DOB 15-Aug-1995, MRN [id number] ? ?PCP:  Shelda Pal, DO  ?Cardiologist:  Jenean Lindau, MD  ? ?Referring MD: Shelda Pal*  ? ? ?ASSESSMENT:   ? ?1. Palpitations   ?2. Hypercholesteremia   ? ?PLAN:   ? ?In order of problems listed above: ? ?Primary prevention stressed with the patient.  Importance of compliance with diet medication stressed and she vocalized understanding. ?Palpitations: These have resolved and she is happy about them.  She is walking on a regular basis. ?Mixed dyslipidemia: Diet emphasized.  Lipids reviewed.  She promises to do better with diet and exercise and lipids followed by primary care.  She will be seen in follow-up appointment on a as needed basis.  She had multiple questions which were answered to her satisfaction. ? ? ?Medication Adjustments/Labs and Tests Ordered: ?Current medicines are reviewed at length with the patient today.  Concerns regarding medicines are outlined above.  ?No orders of the defined types were placed in this encounter. ? ?No orders of the defined types were placed in this encounter. ? ? ? ?No chief complaint on file. ?  ? ?History of Present Illness:   ? ?Crystal Soto is a 26 y.o. female.  Patient has past medical history of palpitations and hyperlipidemia.  She recently underwent fibroid surgery and is doing fine and has recovered well.  She denies any chest pain orthopnea PND or palpitations.  She is active and exercises on a regular basis.  She is happy about this.  At the time of my evaluation, the patient is alert awake oriented and in no distress. ? ?Past Medical History:  ?Diagnosis Date  ? Allergy-induced asthma   ? Breast mass, right 02/12/2018  ? Cardiac murmur 09/25/2020  ? COVID 12/04/2020  ? congestion & runny nose  ? 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 Formatting of this note might be different from the original. Right  breast.  Follow by Dr. Duke Salvia.  Stable.  No intervention.  ? Fibroid uterus 05/05/2021  ? Hypercholesteremia 06/03/2016  ? Mucocele of lower lip 12/17/2016  ? Palpitations 08/08/2020  ? Per 04/29/21 phone call with pt, she was having heart palpitations and PVCs. She said that she reduced her caffeine intake and it is much better now. 10/15/20 EKG showed NSR w/ sinus arrythmia, 10/15/20 Echocardiogram LVEF 55-60%, 10/15/20 Zio monitor all in Epic. LOV with cardiology, Dr. Lennox Pippins on 09/25/20 in Epic.  ? Seasonal allergies   ? Uterine leiomyoma 03/04/2021  ? Wears contact lenses   ? ? ?Past Surgical History:  ?Procedure Laterality Date  ? ORAL MUCOCELE EXCISION  2018  ? WISDOM TOOTH EXTRACTION    ? as a teenager  ? ? ?Current Medications: ?Current Meds  ?Medication Sig  ? albuterol (VENTOLIN HFA) 108 (90 Base) MCG/ACT inhaler Inhale 2 puffs into the lungs every 6 (six) hours as needed for wheezing or shortness of breath.  ? Ascorbic Acid (VITAMIN C) 500 MG CAPS Take 500 mg by mouth daily.  ? ELDERBERRY PO Take 50 mg by mouth daily.  ? folic acid (FOLVITE) 829 MCG tablet Take 400 mcg by mouth daily.  ? ibuprofen (ADVIL) 600 MG tablet Take 1 tablet (600 mg total) by mouth every 6 (six) hours.  ? levonorgestrel-ethinyl estradiol (ALESSE) 0.1-20 MG-MCG tablet Take 1 tablet by mouth in the morning.  ? Multiple Vitamin (MULTIVITAMIN ADULT) TABS  Take 1 tablet by mouth daily.  ? promethazine (PHENERGAN) 25 MG tablet Take 1 tablet (25 mg total) by mouth every 8 (eight) hours as needed for nausea or vomiting (Anxiety).  ? zinc gluconate 50 MG tablet Take 50 mg by mouth daily.  ?  ? ?Allergies:   Patient has no known allergies.  ? ?Social History  ? ?Socioeconomic History  ? Marital status: Married  ?  Spouse name: Not on file  ? Number of children: Not on file  ? Years of education: Not on file  ? Highest education level: Not on file  ?Occupational History  ? Not on file  ?Tobacco Use  ? Smoking status: Never  ? Smokeless tobacco:  Never  ?Vaping Use  ? Vaping Use: Never used  ?Substance and Sexual Activity  ? Alcohol use: Yes  ?  Alcohol/week: 2.0 standard drinks  ?  Types: 2 Glasses of wine per week  ?  Comment: occasionally  ? Drug use: Never  ? Sexual activity: Yes  ?  Birth control/protection: Pill, Condom  ?Other Topics Concern  ? Not on file  ?Social History Narrative  ? Not on file  ? ?Social Determinants of Health  ? ?Financial Resource Strain: Not on file  ?Food Insecurity: Not on file  ?Transportation Needs: Not on file  ?Physical Activity: Not on file  ?Stress: Not on file  ?Social Connections: Not on file  ?  ? ?Family History: ?The patient's family history includes Cancer in her maternal grandmother; Heart disease in her paternal grandfather; High Cholesterol in her maternal grandfather, maternal grandmother, and mother; Hypertension in her maternal grandfather. ? ?ROS:   ?Please see the history of present illness.    ?All other systems reviewed and are negative. ? ?EKGs/Labs/Other Studies Reviewed:   ? ?The following studies were reviewed today: ?I discussed my findings with the patient at length.  Echocardiogram and monitoring reports were unremarkable. ? ? ?Recent Labs: ?08/08/2020: TSH 3.36 ?09/05/2020: ALT 19; BUN 12; Creatinine, Ser 0.79; Potassium 4.7; Sodium 139 ?05/01/2021: Hemoglobin 13.7; Platelets 249  ?Recent Lipid Panel ?   ?Component Value Date/Time  ? CHOL 208 (H) 09/05/2020 0752  ? TRIG 87.0 09/05/2020 0752  ? HDL 50.30 09/05/2020 0752  ? CHOLHDL 4 09/05/2020 0752  ? VLDL 17.4 09/05/2020 0752  ? [place] 141 (H) 09/05/2020 0752  ? ? ?Physical Exam:   ? ?VS:  BP 132/72   Pulse (!) 58   Ht '5\' 4"'$  (1.626 m)   Wt 154 lb 0.6 oz (69.9 kg)   SpO2 99%   BMI 26.44 kg/m?    ? ?Wt Readings from Last 3 Encounters:  ?06/24/21 154 lb 0.6 oz (69.9 kg)  ?05/05/21 156 lb 14.4 oz (71.2 kg)  ?05/01/21 153 lb (69.4 kg)  ?  ? ?GEN: Patient is in no acute distress ?HEENT: Normal ?NECK: No JVD; No carotid bruits ?LYMPHATICS: No  lymphadenopathy ?CARDIAC: Hear sounds regular, 2/6 systolic murmur at the apex. ?RESPIRATORY:  Clear to auscultation without rales, wheezing or rhonchi  ?ABDOMEN: Soft, non-tender, non-distended ?MUSCULOSKELETAL:  No edema; No deformity  ?SKIN: Warm and dry ?NEUROLOGIC:  Alert and oriented x 3 ?PSYCHIATRIC:  Normal affect  ? ?Signed, ?Jenean Lindau, MD  ?06/24/2021 11:38 AM    ?[place]  ?

## 2021-06-24 NOTE — Patient Instructions (Signed)

## 2021-07-20 ENCOUNTER — Other Ambulatory Visit (HOSPITAL_BASED_OUTPATIENT_CLINIC_OR_DEPARTMENT_OTHER): Payer: Self-pay

## 2021-07-20 MED ORDER — CARESTART COVID-19 HOME TEST VI KIT
PACK | 0 refills | Status: DC
  Filled 2021-07-20: qty 4, 8d supply, fill #0

## 2021-07-24 ENCOUNTER — Other Ambulatory Visit: Payer: Self-pay | Admitting: Family Medicine

## 2021-07-24 ENCOUNTER — Other Ambulatory Visit (HOSPITAL_BASED_OUTPATIENT_CLINIC_OR_DEPARTMENT_OTHER): Payer: Self-pay

## 2021-07-24 MED ORDER — LEVONORGESTREL-ETHINYL ESTRAD 0.1-20 MG-MCG PO TABS
1.0000 | ORAL_TABLET | Freq: Every morning | ORAL | 1 refills | Status: DC
  Filled 2021-07-24: qty 84, 84d supply, fill #0

## 2021-08-06 ENCOUNTER — Telehealth: Payer: Self-pay

## 2021-08-06 NOTE — Telephone Encounter (Signed)
[facility] - Client ?Client Site [facility] - Night ?Provider Riki Sheer- MD ?Contact Type Call ?Who Is Calling Patient / Member / Family / Caregiver ?Caller Name Leeanne Butters ?Caller Phone Number [phone] ?Patient Name Crystal Soto ?Patient DOB May 01, 1995 ?Call Type Message Only Information Provided ?Reason for Call Request to Reschedule Office Appointment ?Initial Comment Caller she has an appointment and would like to reschedule. ?Patient request to speak to RN No ?Disp. Time Disposition Final User ?08/06/2021 8:03:39 AM General Information Provided Yes Candise Bowens ?Call Closed By: Candise Bowens ?Transaction Date/Time: 08/06/2021 8:01:52 AM (ET) ?

## 2021-08-18 DIAGNOSIS — Z3200 Encounter for pregnancy test, result unknown: Secondary | ICD-10-CM | POA: Diagnosis not present

## 2021-08-18 DIAGNOSIS — Z3141 Encounter for fertility testing: Secondary | ICD-10-CM | POA: Diagnosis not present

## 2021-08-28 ENCOUNTER — Encounter: Payer: 59 | Admitting: Family Medicine

## 2021-09-08 ENCOUNTER — Encounter: Payer: 59 | Admitting: Family Medicine

## 2021-09-09 ENCOUNTER — Other Ambulatory Visit (HOSPITAL_BASED_OUTPATIENT_CLINIC_OR_DEPARTMENT_OTHER): Payer: Self-pay

## 2021-09-09 ENCOUNTER — Encounter: Payer: Self-pay | Admitting: Family Medicine

## 2021-09-09 ENCOUNTER — Ambulatory Visit (INDEPENDENT_AMBULATORY_CARE_PROVIDER_SITE_OTHER): Payer: 59 | Admitting: Family Medicine

## 2021-09-09 VITALS — BP 118/72 | HR 84 | Temp 98.0°F | Ht 64.0 in | Wt 160.1 lb

## 2021-09-09 DIAGNOSIS — Z0000 Encounter for general adult medical examination without abnormal findings: Secondary | ICD-10-CM | POA: Diagnosis not present

## 2021-09-09 DIAGNOSIS — L729 Follicular cyst of the skin and subcutaneous tissue, unspecified: Secondary | ICD-10-CM

## 2021-09-09 MED ORDER — ONDANSETRON 4 MG PO TBDP
4.0000 mg | ORAL_TABLET | Freq: Three times a day (TID) | ORAL | 0 refills | Status: DC | PRN
  Filled 2021-09-09: qty 20, 7d supply, fill #0

## 2021-09-09 MED ORDER — SCOPOLAMINE 1 MG/3DAYS TD PT72
1.0000 | MEDICATED_PATCH | TRANSDERMAL | 12 refills | Status: DC
  Filled 2021-09-09: qty 10, 30d supply, fill #0

## 2021-09-09 NOTE — Progress Notes (Signed)
Chief Complaint  Patient presents with   Annual Exam     Well Woman Crystal Soto is here for a complete physical.   Her last physical was >1 year ago.  Current diet: in general, diet improving. Current exercise: softball, tennis, lifting weights. Fatigue out of ordinary? No Seatbelt? Yes Advanced directive? 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 Right breast.  Follow by Dr. Duke Salvia.  Stable.  No intervention.   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 She said that she reduced her caffeine intake and it is much better now. 10/15/20 EKG showed NSR w/ sinus arrythmia, 10/15/20 Echocardiogram LVEF 55-60%, 10/15/20 Zio monitor all in Epic. LOV with cardiology, Dr. Lennox Pippins on 09/25/20 in [place].   Seasonal allergies    Uterine leiomyoma 03/04/2021   Wears contact lenses      Past Surgical History:  Procedure Laterality Date   ORAL MUCOCELE EXCISION  2018   WISDOM TOOTH EXTRACTION     as a teenager    Medications  Current Outpatient Medications on File Prior to Visit  Medication Sig Dispense Refill   albuterol (VENTOLIN HFA) 108 (90 Base) MCG/ACT inhaler Inhale 2 puffs into the lungs every 6 (six) hours as needed for wheezing or shortness of breath.     Ascorbic Acid (VITAMIN C) 500 MG CAPS Take 500 mg by mouth daily.     ELDERBERRY PO Take 50 mg by mouth daily.     folic acid (FOLVITE) 831 MCG tablet Take 400 mcg by mouth daily.     ibuprofen (ADVIL) 600 MG tablet Take 1 tablet  (600 mg total) by mouth every 6 (six) hours. 30 tablet 1   levonorgestrel-ethinyl estradiol (ALESSE) 0.1-20 MG-MCG tablet Take 1 tablet by mouth in the morning. 84 tablet 1   Multiple Vitamin (MULTIVITAMIN ADULT) TABS Take 1 tablet by mouth daily.     promethazine (PHENERGAN) 25 MG tablet Take 1 tablet (25 mg total) by mouth every 8 (eight) hours as needed for nausea or vomiting (Anxiety). 30 tablet 0   zinc gluconate 50 MG tablet Take 50 mg by mouth daily.      Allergies No Known Allergies  Review of Systems: Constitutional:  no unexpected weight changes Eye:  no recent significant change in vision Ear/Nose/Mouth/Throat:  Ears:  no tinnitus or vertigo and no recent change in hearing Nose/Mouth/Throat:  no complaints of nasal congestion, no sore throat Cardiovascular: no chest pain Respiratory:  no cough and no shortness of breath Gastrointestinal:  no abdominal pain, no change in bowel habits GU:  Female: negative for dysuria or pelvic pain Musculoskeletal/Extremities:  no pain of the joints Integumentary (Skin/Breast):  no abnormal skin lesions reported Neurologic:  no headaches Endocrine:  denies fatigue Hematologic/Lymphatic:  No areas of easy bleeding  Exam BP 118/72   Pulse 84   Temp 98 F (36.7 C) (Oral)   Ht '5\' 4"'$  (1.626 m)   Wt 160 lb 2 oz (72.6 kg)   SpO2  97%   BMI 27.49 kg/m  General:  well developed, well nourished, in no apparent distress Skin:  no significant moles, warts, or growths Head:  no masses, lesions, or tenderness Eyes:  pupils equal and round, sclera anicteric without injection Ears:  canals without lesions, TMs shiny without retraction, no obvious effusion, no erythema Nose:  nares patent, septum midline, mucosa normal, and no drainage or sinus tenderness Throat/Pharynx:  lips and gingiva without lesion; tongue and uvula midline; non-inflamed pharynx; no exudates or postnasal drainage Neck: neck supple without adenopathy, thyromegaly, or  masses Lungs:  clear to auscultation, breath sounds equal bilaterally, no respiratory distress Cardio:  regular rate and rhythm, no bruits, no LE edema Abdomen:  abdomen soft, nontender; bowel sounds normal; no masses or organomegaly Genital: Defer to GYN Musculoskeletal:  symmetrical muscle groups noted without atrophy or deformity Extremities:  no clubbing, cyanosis, or edema, no deformities, no skin discoloration Neuro:  gait normal; deep tendon reflexes normal and symmetric Psych: well oriented with normal range of affect and appropriate judgment/insight  Assessment and Plan  Well adult exam - Plan: CBC, Comprehensive metabolic panel, Lipid panel  Scalp cyst - Plan: Ambulatory referral to Dermatology   Well 26 y.o. female. Counseled on diet and exercise. Other orders as above. Advanced directive form provided today.  Refer derm at her request.  Follow up in 1 yr. The patient voiced understanding and agreement to the plan.  [place], DO 09/09/21 2:10 PM

## 2021-09-09 NOTE — Patient Instructions (Addendum)
Give us 2-3 business days to get the results of your labs back.   Keep the diet clean and stay active.  Please get me a copy of your advanced directive form at your convenience.   OK to use Debrox (peroxide) in the ear to loosen up wax. Also recommend using a bulb syringe (for removing boogers from baby's noses) to flush through warm water and vinegar (3-4:1 ratio). An alternative, though more expensive, is an elephant ear washer wax removal kit. Do not use Q-tips as this can impact wax further.  Let us know if you need anything.  

## 2021-09-10 LAB — COMPREHENSIVE METABOLIC PANEL
ALT: 26 U/L (ref 0–35)
AST: 40 U/L — ABNORMAL HIGH (ref 0–37)
Albumin: 4.6 g/dL (ref 3.5–5.2)
Alkaline Phosphatase: 54 U/L (ref 39–117)
BUN: 10 mg/dL (ref 6–23)
CO2: 27 mEq/L (ref 19–32)
Calcium: 9.7 mg/dL (ref 8.4–10.5)
Chloride: 103 mEq/L (ref 96–112)
Creatinine, Ser: 0.74 mg/dL (ref 0.40–1.20)
GFR: 111.79 mL/min (ref >60.00)
Glucose, Bld: 75 mg/dL (ref 70–99)
Potassium: 4.2 mEq/L (ref 3.5–5.1)
Sodium: 140 mEq/L (ref 135–145)
Total Bilirubin: 0.9 mg/dL (ref 0.2–1.2)
Total Protein: 7 g/dL (ref 6.0–8.3)

## 2021-09-10 LAB — CBC
HCT: 43.4 % (ref 36.0–46.0)
Hemoglobin: 14.4 g/dL (ref 12.0–15.0)
MCHC: 33.2 g/dL (ref 30.0–36.0)
MCV: 87 fl (ref 78.0–100.0)
Platelets: 241 10*3/uL (ref 150.0–400.0)
RBC: 4.98 Mil/uL (ref 3.87–5.11)
RDW: 13.6 % (ref 11.5–15.5)
WBC: 7 10*3/uL (ref 4.0–10.5)

## 2021-09-10 LAB — LIPID PANEL
Cholesterol: 238 mg/dL — ABNORMAL HIGH (ref 0–200)
HDL: 59.4 mg/dL (ref >39.00)
LDL Cholesterol: 154 mg/dL — ABNORMAL HIGH (ref 0–99)
NonHDL: 178.72
Total CHOL/HDL Ratio: 4
Triglycerides: 126 mg/dL (ref 0.0–149.0)
VLDL: 25.2 mg/dL (ref 0.0–40.0)

## 2021-09-11 ENCOUNTER — Other Ambulatory Visit: Payer: Self-pay | Admitting: Family Medicine

## 2021-09-11 DIAGNOSIS — E7800 Pure hypercholesterolemia, unspecified: Secondary | ICD-10-CM

## 2021-09-14 DIAGNOSIS — D485 Neoplasm of uncertain behavior of skin: Secondary | ICD-10-CM | POA: Diagnosis not present

## 2021-09-14 DIAGNOSIS — D2272 Melanocytic nevi of left lower limb, including hip: Secondary | ICD-10-CM | POA: Diagnosis not present

## 2021-09-14 DIAGNOSIS — L7211 Pilar cyst: Secondary | ICD-10-CM | POA: Diagnosis not present

## 2021-10-07 ENCOUNTER — Encounter: Payer: Self-pay | Admitting: Family Medicine

## 2021-10-07 ENCOUNTER — Other Ambulatory Visit (INDEPENDENT_AMBULATORY_CARE_PROVIDER_SITE_OTHER): Payer: 59

## 2021-10-07 DIAGNOSIS — E7800 Pure hypercholesterolemia, unspecified: Secondary | ICD-10-CM | POA: Diagnosis not present

## 2021-10-07 LAB — LIPID PANEL
Cholesterol: 228 mg/dL — ABNORMAL HIGH (ref 0–200)
HDL: 55.4 mg/dL (ref >39.00)
LDL Cholesterol: 153 mg/dL — ABNORMAL HIGH (ref 0–99)
NonHDL: 172.24
Total CHOL/HDL Ratio: 4
Triglycerides: 97 mg/dL (ref 0.0–149.0)
VLDL: 19.4 mg/dL (ref 0.0–40.0)

## 2021-10-07 LAB — HEPATIC FUNCTION PANEL
ALT: 19 U/L (ref 0–35)
AST: 21 U/L (ref 0–37)
Albumin: 4.7 g/dL (ref 3.5–5.2)
Alkaline Phosphatase: 51 U/L (ref 39–117)
Bilirubin, Direct: 0.2 mg/dL (ref 0.0–0.3)
Total Bilirubin: 1 mg/dL (ref 0.2–1.2)
Total Protein: 7.1 g/dL (ref 6.0–8.3)

## 2021-11-03 DIAGNOSIS — D485 Neoplasm of uncertain behavior of skin: Secondary | ICD-10-CM | POA: Diagnosis not present

## 2021-11-03 DIAGNOSIS — D2271 Melanocytic nevi of right lower limb, including hip: Secondary | ICD-10-CM | POA: Diagnosis not present

## 2021-11-03 DIAGNOSIS — L988 Other specified disorders of the skin and subcutaneous tissue: Secondary | ICD-10-CM | POA: Diagnosis not present

## 2021-11-18 DIAGNOSIS — Z1389 Encounter for screening for other disorder: Secondary | ICD-10-CM | POA: Diagnosis not present

## 2021-11-18 DIAGNOSIS — Z130 Encounter for screening for diseases of the blood and blood-forming organs and certain disorders involving the immune mechanism: Secondary | ICD-10-CM | POA: Diagnosis not present

## 2021-11-18 DIAGNOSIS — Z01419 Encounter for gynecological examination (general) (routine) without abnormal findings: Secondary | ICD-10-CM | POA: Diagnosis not present

## 2022-01-01 ENCOUNTER — Other Ambulatory Visit (HOSPITAL_BASED_OUTPATIENT_CLINIC_OR_DEPARTMENT_OTHER): Payer: Self-pay

## 2022-01-01 ENCOUNTER — Other Ambulatory Visit: Payer: Self-pay | Admitting: Family Medicine

## 2022-01-01 MED ORDER — ALBUTEROL SULFATE HFA 108 (90 BASE) MCG/ACT IN AERS
2.0000 | INHALATION_SPRAY | Freq: Four times a day (QID) | RESPIRATORY_TRACT | 5 refills | Status: DC | PRN
  Filled 2022-01-01: qty 6.7, 25d supply, fill #0

## 2022-01-19 ENCOUNTER — Other Ambulatory Visit (HOSPITAL_BASED_OUTPATIENT_CLINIC_OR_DEPARTMENT_OTHER): Payer: Self-pay

## 2022-01-19 MED ORDER — FLUARIX QUADRIVALENT 0.5 ML IM SUSY
PREFILLED_SYRINGE | INTRAMUSCULAR | 0 refills | Status: DC
  Filled 2022-01-19: qty 0.5, 1d supply, fill #0

## 2022-02-25 DIAGNOSIS — L7211 Pilar cyst: Secondary | ICD-10-CM | POA: Diagnosis not present

## 2022-03-08 ENCOUNTER — Other Ambulatory Visit: Payer: Self-pay | Admitting: Family Medicine

## 2022-03-08 ENCOUNTER — Encounter: Payer: Self-pay | Admitting: Family Medicine

## 2022-03-08 DIAGNOSIS — N939 Abnormal uterine and vaginal bleeding, unspecified: Secondary | ICD-10-CM

## 2022-03-11 ENCOUNTER — Other Ambulatory Visit (INDEPENDENT_AMBULATORY_CARE_PROVIDER_SITE_OTHER): Payer: 59

## 2022-03-11 DIAGNOSIS — Z3201 Encounter for pregnancy test, result positive: Secondary | ICD-10-CM | POA: Diagnosis not present

## 2022-03-11 DIAGNOSIS — N939 Abnormal uterine and vaginal bleeding, unspecified: Secondary | ICD-10-CM

## 2022-03-11 LAB — HCG, QUANTITATIVE, PREGNANCY: Quantitative HCG: 4997 m[IU]/mL

## 2022-04-01 DIAGNOSIS — Z3401 Encounter for supervision of normal first pregnancy, first trimester: Secondary | ICD-10-CM | POA: Diagnosis not present

## 2022-04-01 DIAGNOSIS — Z369 Encounter for antenatal screening, unspecified: Secondary | ICD-10-CM | POA: Diagnosis not present

## 2022-04-01 DIAGNOSIS — O26891 Other specified pregnancy related conditions, first trimester: Secondary | ICD-10-CM | POA: Diagnosis not present

## 2022-04-01 DIAGNOSIS — Z113 Encounter for screening for infections with a predominantly sexual mode of transmission: Secondary | ICD-10-CM | POA: Diagnosis not present

## 2022-04-01 DIAGNOSIS — Z3A09 9 weeks gestation of pregnancy: Secondary | ICD-10-CM | POA: Diagnosis not present

## 2022-04-01 LAB — OB RESULTS CONSOLE HIV ANTIBODY (ROUTINE TESTING): HIV: NONREACTIVE

## 2022-04-01 LAB — OB RESULTS CONSOLE ABO/RH: RH Type: POSITIVE

## 2022-04-01 LAB — OB RESULTS CONSOLE RUBELLA ANTIBODY, IGM: Rubella: IMMUNE

## 2022-04-01 LAB — OB RESULTS CONSOLE GC/CHLAMYDIA
Chlamydia: NEGATIVE
Neisseria Gonorrhea: NEGATIVE

## 2022-04-01 LAB — OB RESULTS CONSOLE ANTIBODY SCREEN: Antibody Screen: NEGATIVE

## 2022-04-01 LAB — HEPATITIS C ANTIBODY: HCV Ab: NEGATIVE

## 2022-04-01 LAB — OB RESULTS CONSOLE HEPATITIS B SURFACE ANTIGEN: Hepatitis B Surface Ag: NEGATIVE

## 2022-04-01 LAB — OB RESULTS CONSOLE RPR: RPR: NONREACTIVE

## 2022-04-05 DIAGNOSIS — O200 Threatened abortion: Secondary | ICD-10-CM | POA: Diagnosis not present

## 2022-04-05 DIAGNOSIS — Z3A08 8 weeks gestation of pregnancy: Secondary | ICD-10-CM | POA: Diagnosis not present

## 2022-05-24 IMAGING — US US PELVIS COMPLETE WITH TRANSVAGINAL
2 series · 13 of 25 positions shown · non-contrast
Comparison: None

CLINICAL DATA: Abdominal fullness and LEFT lower quadrant, family
history of fibroids, LMP 12/30/2020

EXAM:
TRANSABDOMINAL AND TRANSVAGINAL ULTRASOUND OF PELVIS
TECHNIQUE: Both transabdominal and transvaginal ultrasound examinations of the
pelvis were performed. Transabdominal technique was performed for
global imaging of the pelvis including uterus, ovaries, adnexal
regions, and pelvic cul-de-sac. It was necessary to proceed with
endovaginal exam following the transabdominal exam to visualize the
endometrium and ovaries.

[Series 1: us pelvis complete with transvaginal · 12 of 179 slices shown (1 of 2)]
[im 1/179]
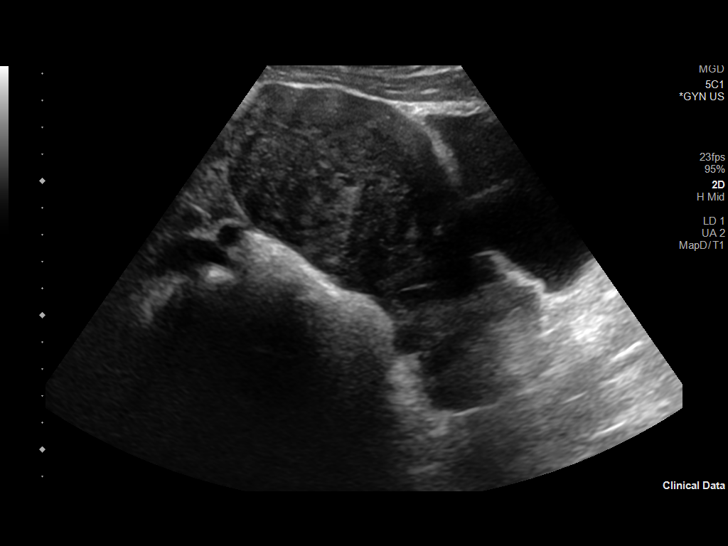
[im 16/179]
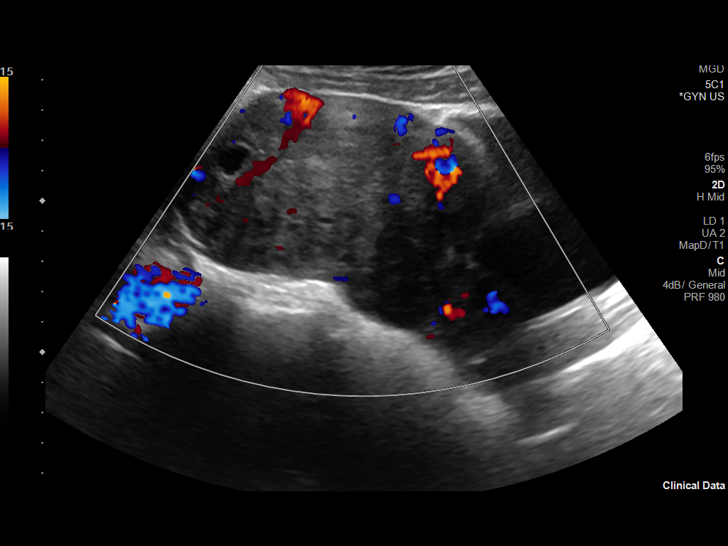
[im 31/179]
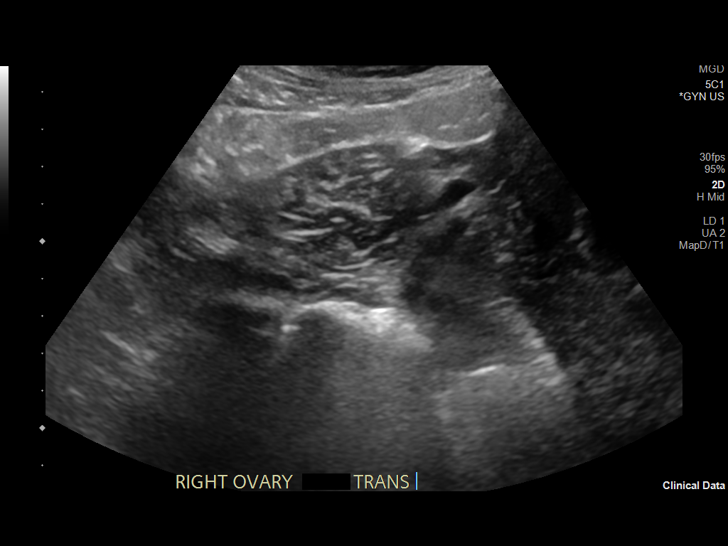
[im 47/179]
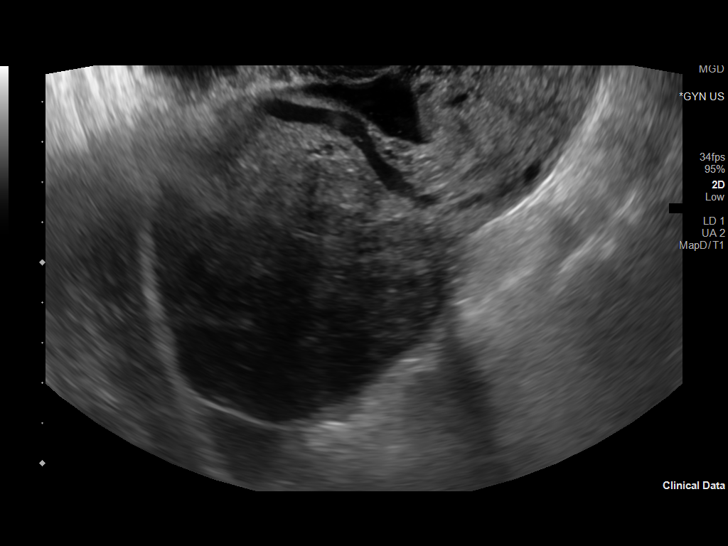
[im 62/179]
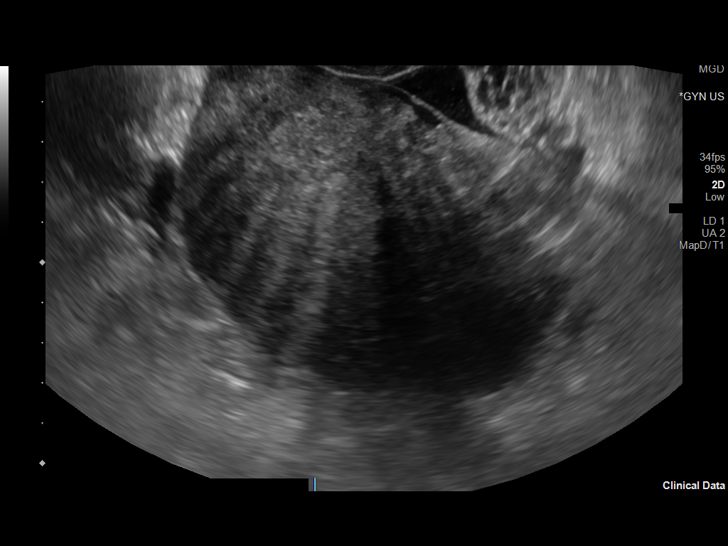
[im 78/179]
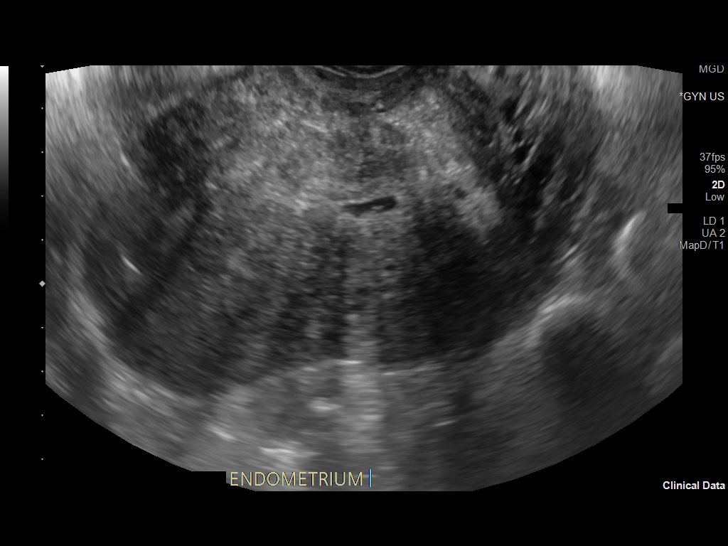
[im 93/179]
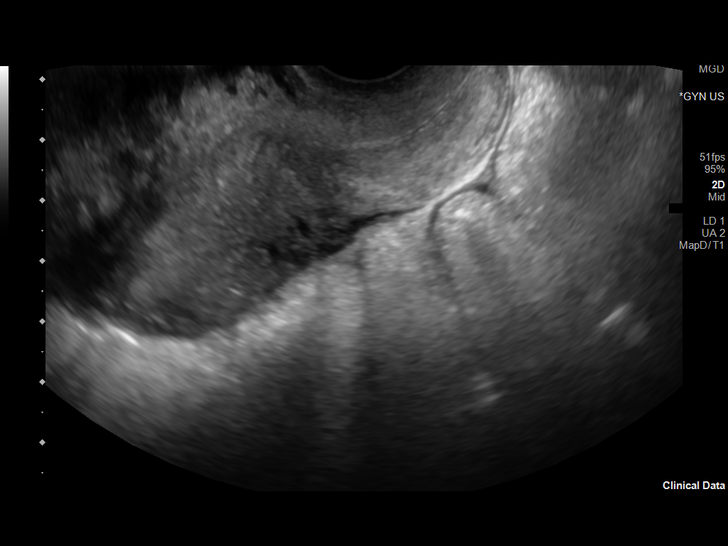
[im 109/179]
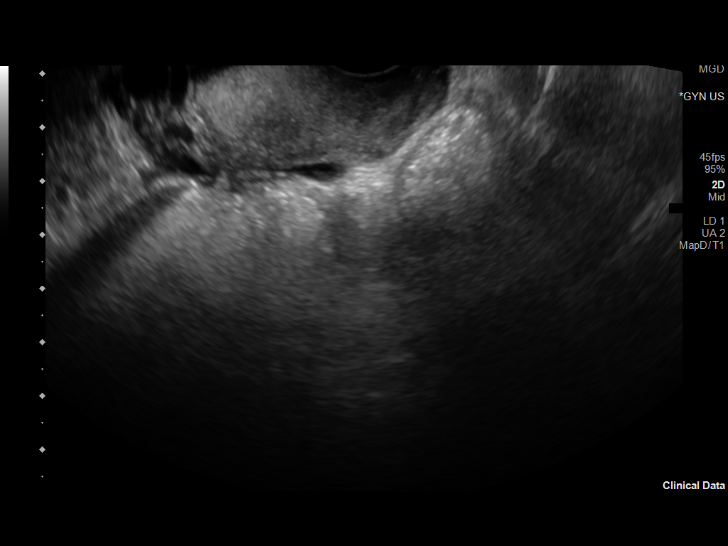
[im 124/179]
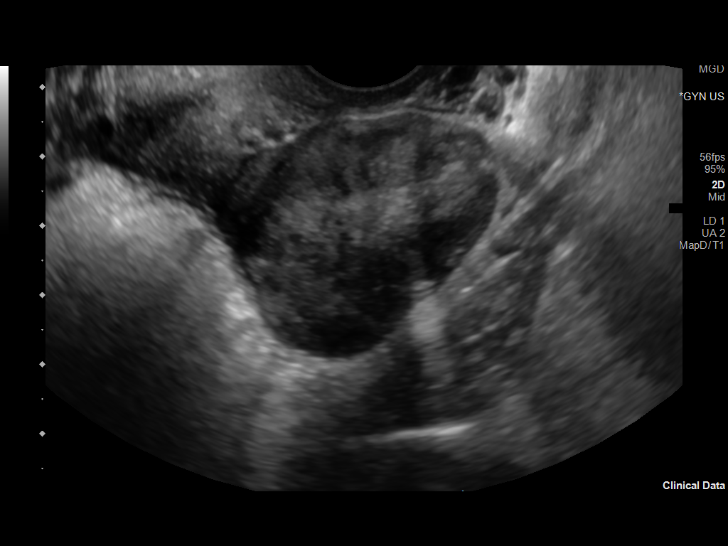
[im 140/179]
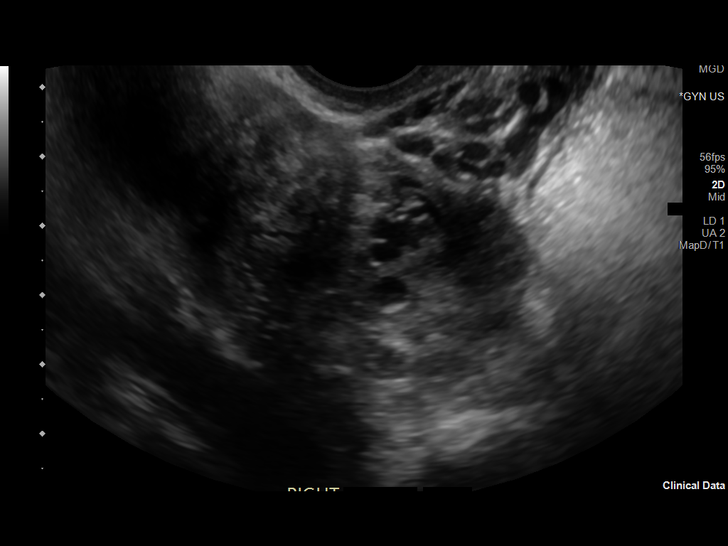
[im 155/179]
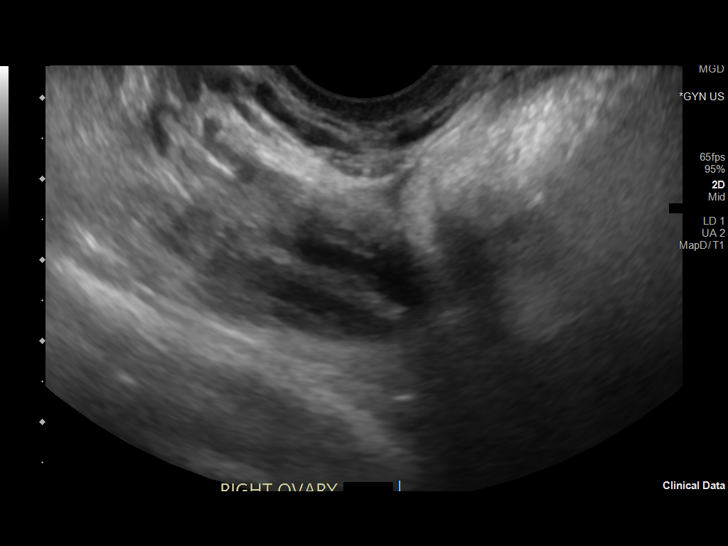
[im 171/179]
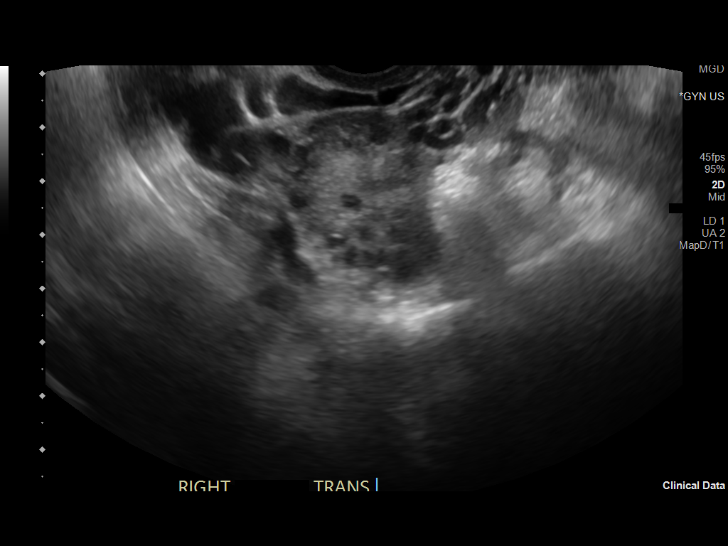

[Series 2: us pelvis complete with transvaginal · 1 of 2 slices shown (2 of 2)]
[im 1/2]
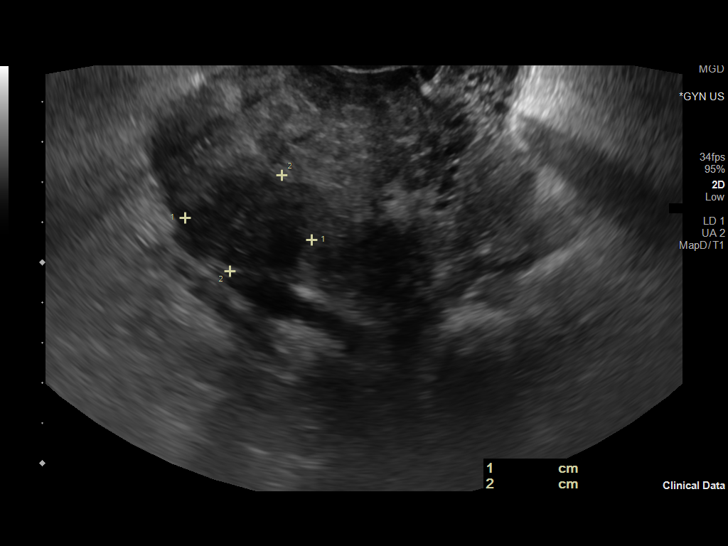

[13 of 25 positions shown; findings below may reference images not displayed]

FINDINGS: Uterus

Measurements: 14.6 x 7.5 x 7.3 cm = volume: 425 mL. Heterogeneous
increased echogenicity in enlargement with scattered areas of
shadowing and ill definition of the endometrial complex. Findings
suggest adenomyosis. Questionable subserosal leiomyoma at posterior
uterus 3.9 x 3.4 x 3.6 cm versus focal adenomyosis. Additional mass
identified LEFT lateral to the uterus 3.6 x 3.2 x 3.2 cm, suspect
exophytic leiomyoma though a LEFT adnexal mass/LEFT ovarian masses
not completely excluded.

Endometrium

Thickness: 8 mm.  Trace endometrial fluid.

Right ovary

Measurements: 3.2 x 1.7 x 2.4 cm = volume: 7.4 mL. Normal morphology
without mass

Left ovary

Not definitely visualized, though a LEFT adnexal mass seen adjacent
to the uterus as noted above could represent a LEFT ovarian mass
rather than an exophytic leiomyoma.

Other findings

Trace free pelvic fluid.  No other adnexal masses.
IMPRESSION: Enlarged uterus with diffusely heterogeneous myometrium, endometrial
complex and areas of shadowing ill-defined endometrial complex
raising question of adenomyosis.

Suspected subserosal leiomyoma at posterior uterus 3.9 cm diameter
versus focal adenomyosis.

LEFT adnexal mass 3.6 x 3.2 x 3.2 cm, favor exophytic leiomyoma
though the LEFT ovary is not otherwise visualized and a LEFT ovarian
mass lesion is not excluded; characterization of this lesion by MR
imaging with and without contrast recommended to exclude solid LEFT
ovarian neoplasm

Unremarkable uterus, endometrial complex and RIGHT ovary.

## 2022-06-11 DIAGNOSIS — O3429 Maternal care due to uterine scar from other previous surgery: Secondary | ICD-10-CM | POA: Diagnosis not present

## 2022-06-11 DIAGNOSIS — Z3A18 18 weeks gestation of pregnancy: Secondary | ICD-10-CM | POA: Diagnosis not present

## 2022-06-11 DIAGNOSIS — Z363 Encounter for antenatal screening for malformations: Secondary | ICD-10-CM | POA: Diagnosis not present

## 2022-07-12 DIAGNOSIS — Z3483 Encounter for supervision of other normal pregnancy, third trimester: Secondary | ICD-10-CM | POA: Diagnosis not present

## 2022-07-12 DIAGNOSIS — Z3482 Encounter for supervision of other normal pregnancy, second trimester: Secondary | ICD-10-CM | POA: Diagnosis not present

## 2022-08-20 DIAGNOSIS — Z3689 Encounter for other specified antenatal screening: Secondary | ICD-10-CM | POA: Diagnosis not present

## 2022-08-20 DIAGNOSIS — Z23 Encounter for immunization: Secondary | ICD-10-CM | POA: Diagnosis not present

## 2022-09-13 ENCOUNTER — Encounter: Payer: Self-pay | Admitting: Family Medicine

## 2022-09-13 ENCOUNTER — Ambulatory Visit (INDEPENDENT_AMBULATORY_CARE_PROVIDER_SITE_OTHER): Payer: 59 | Admitting: Family Medicine

## 2022-09-13 VITALS — BP 111/68 | HR 95 | Temp 97.9°F | Ht 64.0 in | Wt 178.2 lb

## 2022-09-13 DIAGNOSIS — Z0000 Encounter for general adult medical examination without abnormal findings: Secondary | ICD-10-CM | POA: Diagnosis not present

## 2022-09-13 NOTE — Progress Notes (Signed)
Chief Complaint  Patient presents with   Annual Exam     Well Woman Crystal Soto is here for a complete physical.   Her last physical was >1 year ago.  Current diet: in general, a "healthy" diet. Current exercise: walking. Fatigue out of ordinary? No Seatbelt? Yes Advanced directive? 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 Right breast.  Follow by Dr. Alva Garnet.  Stable.  No intervention.   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 She said that she reduced her caffeine intake and it is much better now. 10/15/20 EKG showed NSR w/ sinus arrythmia, 10/15/20 Echocardiogram LVEF 55-60%, 10/15/20 Zio monitor all in Epic. LOV with cardiology, Dr. Josiah Lobo on 09/25/20 in Epic.   Seasonal allergies    Uterine leiomyoma 03/04/2021   Wears contact lenses      Past Surgical History:  Procedure Laterality Date   ORAL MUCOCELE EXCISION  2018   WISDOM TOOTH EXTRACTION     as a teenager    Medications  Current Outpatient Medications on File Prior to Visit  Medication Sig Dispense Refill   albuterol (VENTOLIN HFA) 108 (90 Base) MCG/ACT inhaler Inhale 2 puffs into the lungs every 6 (six) hours as needed for wheezing or shortness of breath. 6.7 g 5   ondansetron (ZOFRAN-ODT) 4 MG disintegrating tablet Take 1 tablet (4 mg total) by mouth every 8 (eight) hours as needed for nausea or vomiting. 20 tablet 0    Allergies No Known Allergies  Review of Systems: Constitutional:  no unexpected  weight changes Eye:  no recent significant change in vision Ear/Nose/Mouth/Throat:  Ears:  no tinnitus or vertigo and no recent change in hearing Nose/Mouth/Throat:  no complaints of nasal congestion, no sore throat Cardiovascular: no chest pain Respiratory:  no cough and no shortness of breath Gastrointestinal:  no abdominal pain, no change in bowel habits GU:  Female: negative for dysuria or pelvic pain Musculoskeletal/Extremities:  no pain of the joints Integumentary (Skin/Breast):  no abnormal skin lesions reported Neurologic:  no headaches Endocrine:  denies fatigue Hematologic/Lymphatic:  No areas of easy bleeding  Exam BP 111/68 (BP Location: Left Arm, Patient Position: Sitting, Cuff Size: Normal)   Pulse 95   Temp 97.9 F (36.6 C) (Oral)   Ht 5\' 4"  (1.626 m)   Wt 178 lb 4 oz (80.9 kg)   SpO2 97%   BMI 30.60 kg/m  General:  well developed, well nourished, in no apparent distress Skin:  no significant moles, warts, or growths Head:  no masses, lesions, or tenderness Eyes:  pupils equal and round, sclera anicteric without injection Ears:  canals without lesions, TMs shiny without retraction, no obvious effusion, no erythema Nose:  nares patent, mucosa normal, and no drainage  Throat/Pharynx:  lips and gingiva without lesion; tongue and uvula midline; non-inflamed pharynx; no exudates or postnasal drainage Neck: neck supple without adenopathy, thyromegaly, or masses Lungs:  clear to auscultation, breath sounds equal bilaterally, no respiratory distress Cardio:  regular  rate and rhythm, no bruits, no LE edema Abdomen:  abdomen soft, nontender; bowel sounds normal; no masses or organomegaly Genital: Defer to GYN Musculoskeletal:  symmetrical muscle groups noted without atrophy or deformity Extremities:  no clubbing, cyanosis, or edema, no deformities, no skin discoloration Neuro:  gait normal; deep tendon reflexes normal and symmetric Psych: well oriented with normal range of  affect and appropriate judgment/insight  Assessment and Plan  Well adult exam - Plan: Lipid panel   Well 27 y.o. female. Counseled on diet and exercise. She is pregnant. Delivering July via C section. She will message [place] to sched a lab visit for her lipid panel at her convenience after that.  Advanced directive form requested today.  Other orders as above. Follow up 1 yr. The patient voiced understanding and agreement to the plan.  Crystal Roche [place], DO 09/13/22 9:22 AM

## 2022-09-13 NOTE — Patient Instructions (Addendum)
Schedule your lab visit at your convenience.   Keep the diet clean and stay active.  Let us know if you need anything.

## 2022-10-06 ENCOUNTER — Encounter (HOSPITAL_COMMUNITY): Payer: Self-pay | Admitting: *Deleted

## 2022-10-06 NOTE — Patient Instructions (Signed)
Crystal Soto  10/06/2022   Your procedure is scheduled on:  10/21/2022  Arrive at 0530 at Entrance C on [address] at [facility]  and [employer]. You are invited to use the FREE valet parking or use the Visitor's parking deck.  Pick up the phone at the desk and dial [phone].  Call this number if you have problems the morning of surgery: [phone]  Remember:   Do not eat food:(After Midnight) Desps de medianoche.  Do not drink clear liquids: (After Midnight) Desps de medianoche.  Take these medicines the morning of surgery with A SIP OF WATER:  none   Do not wear jewelry, make-up or nail polish.  Do not wear lotions, powders, or perfumes. Do not wear deodorant.  Do not shave 48 hours prior to surgery.  Do not bring valuables to the hospital.  [facility] is not   responsible for any belongings or valuables brought to the hospital.  Contacts, dentures or bridgework may not be worn into surgery.  Leave suitcase in the car. After surgery it may be brought to your room.  For patients admitted to the hospital, checkout time is 11:00 AM the day of              discharge.      Please read over the following fact sheets that you were given:     Preparing for Surgery

## 2022-10-08 ENCOUNTER — Encounter (HOSPITAL_COMMUNITY): Payer: Self-pay

## 2022-10-13 DIAGNOSIS — Z3685 Encounter for antenatal screening for Streptococcus B: Secondary | ICD-10-CM | POA: Diagnosis not present

## 2022-10-18 NOTE — Anesthesia Preprocedure Evaluation (Signed)
Anesthesia Evaluation  Patient identified by MRN, date of birth, ID band Patient awake    Reviewed: Allergy & Precautions, NPO status , Patient's Chart, lab work & pertinent test results  Airway Mallampati: II  TM Distance: >3 FB Neck ROM: Full    Dental  (+) Teeth Intact, Dental Advisory Given   Pulmonary asthma (w/ seasonal allergies)    Pulmonary exam normal breath sounds clear to auscultation       Cardiovascular negative cardio ROS Normal cardiovascular exam Rhythm:Regular Rate:Normal  Echo 2022 for murmur heard on exam  1. Left ventricular ejection fraction, by estimation, is 55 to 60%. The  left ventricle has normal function. The left ventricle has no regional  wall motion abnormalities. Left ventricular diastolic parameters were  normal.   2. Right ventricular systolic function is normal. The right ventricular  size is normal. There is normal pulmonary artery systolic pressure.   3. The mitral valve is normal in structure. No evidence of mitral valve  regurgitation. No evidence of mitral stenosis.   4. The aortic valve is normal in structure. Aortic valve regurgitation is  not visualized. No aortic stenosis is present.   5. The inferior vena cava is normal in size with greater than 50%  respiratory variability, suggesting right atrial pressure of 3 mmHg.     Neuro/Psych negative neurological ROS  negative psych ROS   GI/Hepatic negative GI ROS, Neg liver ROS,,,  Endo/Other  negative endocrine ROS    Renal/GU negative Renal ROS  negative genitourinary   Musculoskeletal negative musculoskeletal ROS (+)    Abdominal   Peds  Hematology negative hematology ROS (+) Hb 12.1, plt 190   Anesthesia Other Findings   Reproductive/Obstetrics (+) Pregnancy Hx myomectomy                             Anesthesia Physical Anesthesia Plan  ASA: 2  Anesthesia Plan: Spinal   Post-op Pain  Management: Regional block, Ofirmev IV (intra-op)* and Toradol IV (intra-op)*   Induction:   PONV Risk Score and Plan: 3 and Ondansetron, Dexamethasone and Treatment may vary due to age or medical condition  Airway Management Planned: Natural Airway and Nasal Cannula  Additional Equipment: None  Intra-op Plan:   Post-operative Plan:   Informed Consent: I have reviewed the patients History and Physical, chart, labs and discussed the procedure including the risks, benefits and alternatives for the proposed anesthesia with the patient or authorized representative who has indicated his/her understanding and acceptance.       Plan Discussed with: CRNA  Anesthesia Plan Comments:         Anesthesia Quick Evaluation

## 2022-10-19 ENCOUNTER — Encounter (HOSPITAL_COMMUNITY)
Admission: RE | Admit: 2022-10-19 | Discharge: 2022-10-19 | Disposition: A | Discharge status: Home / Self Care | Payer: 59 | Source: Ambulatory Visit | Attending: Obstetrics and Gynecology | Admitting: Obstetrics and Gynecology

## 2022-10-19 DIAGNOSIS — Z01812 Encounter for preprocedural laboratory examination: Secondary | ICD-10-CM | POA: Insufficient documentation

## 2022-10-19 DIAGNOSIS — O3413 Maternal care for benign tumor of corpus uteri, third trimester: Secondary | ICD-10-CM | POA: Diagnosis present

## 2022-10-19 DIAGNOSIS — D62 Acute posthemorrhagic anemia: Secondary | ICD-10-CM | POA: Diagnosis not present

## 2022-10-19 DIAGNOSIS — Z8616 Personal history of COVID-19: Secondary | ICD-10-CM | POA: Diagnosis not present

## 2022-10-19 DIAGNOSIS — D252 Subserosal leiomyoma of uterus: Secondary | ICD-10-CM | POA: Diagnosis present

## 2022-10-19 DIAGNOSIS — O3429 Maternal care due to uterine scar from other previous surgery: Secondary | ICD-10-CM | POA: Insufficient documentation

## 2022-10-19 DIAGNOSIS — O99824 Streptococcus B carrier state complicating childbirth: Secondary | ICD-10-CM | POA: Diagnosis present

## 2022-10-19 DIAGNOSIS — Z3A37 37 weeks gestation of pregnancy: Secondary | ICD-10-CM | POA: Diagnosis not present

## 2022-10-19 DIAGNOSIS — O34219 Maternal care for unspecified type scar from previous cesarean delivery: Secondary | ICD-10-CM | POA: Diagnosis not present

## 2022-10-19 DIAGNOSIS — O9081 Anemia of the puerperium: Secondary | ICD-10-CM | POA: Diagnosis not present

## 2022-10-19 LAB — CBC
HCT: 37.2 % (ref 36.0–46.0)
Hemoglobin: 12.1 g/dL (ref 12.0–15.0)
MCH: 27.9 pg (ref 26.0–34.0)
MCHC: 32.5 g/dL (ref 30.0–36.0)
MCV: 85.7 fL (ref 80.0–100.0)
Platelets: 190 10*3/uL (ref 150–400)
RBC: 4.34 MIL/uL (ref 3.87–5.11)
RDW: 13.9 % (ref 11.5–15.5)
WBC: 6.8 10*3/uL (ref 4.0–10.5)
nRBC: 0 % (ref 0.0–0.2)

## 2022-10-19 LAB — TYPE AND SCREEN
ABO/RH(D): A POS
Antibody Screen: NEGATIVE

## 2022-10-20 LAB — RPR: RPR Ser Ql: NONREACTIVE

## 2022-10-20 NOTE — H&P (Signed)
Crystal Soto is a 27 y.o. 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  PNC otherwise uncomplicated.  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 Right breast.  Follow by Dr. Alva Garnet.  Stable.  No intervention.   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 She said that she reduced her caffeine intake and it is much better now. 10/15/20 EKG showed NSR w/ sinus arrythmia, 10/15/20 Echocardiogram LVEF 55-60%, 10/15/20 Zio monitor all in Epic. LOV with cardiology, Dr. Josiah Lobo on 09/25/20 in Epic.   Seasonal allergies    Uterine leiomyoma 03/04/2021   Wears contact lenses    Past Surgical History:  Procedure Laterality Date   MYOMECTOMY     ORAL MUCOCELE EXCISION  2018   WISDOM TOOTH EXTRACTION     as a teenager   Family History: family history includes Cancer in her maternal grandmother; Heart disease in her paternal grandfather; High Cholesterol in her maternal grandfather, maternal grandmother, and mother; Hypertension in her maternal grandfather. Social History:  reports that she has never smoked. She has never used smokeless tobacco. She reports current alcohol use of about 2.0 standard drinks of alcohol per week. She reports that she does not use drugs.     Maternal Diabetes: No Genetic Screening: Declined Maternal  Ultrasounds/Referrals: Normal Fetal Ultrasounds or other Referrals:  None Maternal Substance Abuse:  No Significant Maternal Medications:  None Significant Maternal Lab Results:  Group B Strep positive Number of Prenatal Visits:greater than 3 verified prenatal visits Other Comments:  None  Review of Systems  Respiratory: Negative.    Cardiovascular: Negative.    Maternal Medical History:  Fetal activity: Perceived fetal activity is normal.   Prenatal Complications - Diabetes: none.     There were no vitals taken for this visit. Maternal Exam:  Abdomen: Patient reports no abdominal tenderness. Estimated fetal weight is 6.5 lbs.   Introitus: Amniotic fluid character: not assessed.   Physical Exam Constitutional:      Appearance: Normal appearance.  Cardiovascular:     Rate and Rhythm: Normal rate and regular rhythm.     Heart sounds: Normal heart sounds. No murmur heard. Pulmonary:     Effort: Pulmonary effort is normal. No respiratory distress.     Breath sounds: Normal breath sounds. No wheezing.  Abdominal:     Palpations: Abdomen is soft.  Musculoskeletal:     Cervical back: Normal range of motion and neck supple.  Neurological:     Mental Status: She is alert.  Prenatal labs: ABO, Rh: --/--/A POS (07/23 4098) Antibody: NEG (07/23 1191) Rubella: Immune (01/04 0000) RPR: NON REACTIVE (07/23 0900)  HBsAg: Negative (01/04 0000)  HIV: Non-reactive (01/04 0000)  GBS:   pos  Assessment/Plan: IUP at 37+ weeks, previous myomectomy, for scheduled c-section at 37 weeks.  Surgical procedure and risks discussed, questions answered.  Will admit for c-section   Zenaida Niece 10/20/2022, 7:34 PM

## 2022-10-21 ENCOUNTER — Encounter (HOSPITAL_COMMUNITY): Payer: Self-pay | Admitting: Obstetrics and Gynecology

## 2022-10-21 ENCOUNTER — Other Ambulatory Visit: Payer: Self-pay

## 2022-10-21 ENCOUNTER — Inpatient Hospital Stay (HOSPITAL_COMMUNITY): Payer: 59 | Admitting: Anesthesiology

## 2022-10-21 ENCOUNTER — Inpatient Hospital Stay (HOSPITAL_COMMUNITY)
Admission: RE | Admit: 2022-10-21 | Discharge: 2022-10-23 | DRG: 787 | Disposition: A | Discharge status: Home / Self Care | Payer: 59 | Attending: Obstetrics and Gynecology | Admitting: Obstetrics and Gynecology

## 2022-10-21 ENCOUNTER — Encounter (HOSPITAL_COMMUNITY)
Admission: RE | Disposition: A | Discharge status: Home / Self Care | Payer: Self-pay | Source: Home / Self Care | Attending: Obstetrics and Gynecology

## 2022-10-21 DIAGNOSIS — Z8616 Personal history of COVID-19: Secondary | ICD-10-CM

## 2022-10-21 DIAGNOSIS — O9081 Anemia of the puerperium: Secondary | ICD-10-CM | POA: Diagnosis not present

## 2022-10-21 DIAGNOSIS — D62 Acute posthemorrhagic anemia: Secondary | ICD-10-CM | POA: Diagnosis not present

## 2022-10-21 DIAGNOSIS — O3413 Maternal care for benign tumor of corpus uteri, third trimester: Secondary | ICD-10-CM | POA: Diagnosis present

## 2022-10-21 DIAGNOSIS — Z3A37 37 weeks gestation of pregnancy: Secondary | ICD-10-CM

## 2022-10-21 DIAGNOSIS — O99824 Streptococcus B carrier state complicating childbirth: Secondary | ICD-10-CM | POA: Diagnosis present

## 2022-10-21 DIAGNOSIS — Z98891 History of uterine scar from previous surgery: Secondary | ICD-10-CM

## 2022-10-21 DIAGNOSIS — O3429 Maternal care due to uterine scar from other previous surgery: Principal | ICD-10-CM | POA: Diagnosis present

## 2022-10-21 DIAGNOSIS — D252 Subserosal leiomyoma of uterus: Secondary | ICD-10-CM | POA: Diagnosis present

## 2022-10-21 DIAGNOSIS — O34219 Maternal care for unspecified type scar from previous cesarean delivery: Secondary | ICD-10-CM

## 2022-10-21 HISTORY — DX: Maternal care due to uterine scar from other previous surgery: O34.29

## 2022-10-21 HISTORY — DX: History of uterine scar from previous surgery: Z98.891

## 2022-10-21 LAB — CBC
HCT: 31.2 % — ABNORMAL LOW (ref 36.0–46.0)
Hemoglobin: 10.5 g/dL — ABNORMAL LOW (ref 12.0–15.0)
MCH: 28.8 pg (ref 26.0–34.0)
MCHC: 33.7 g/dL (ref 30.0–36.0)
MCV: 85.7 fL (ref 80.0–100.0)
Platelets: 164 10*3/uL (ref 150–400)
RBC: 3.64 MIL/uL — ABNORMAL LOW (ref 3.87–5.11)
RDW: 13.7 % (ref 11.5–15.5)
WBC: 15.6 10*3/uL — ABNORMAL HIGH (ref 4.0–10.5)
nRBC: 0 % (ref 0.0–0.2)

## 2022-10-21 LAB — BIRTH TISSUE RECOVERY COLLECTION (PLACENTA DONATION)

## 2022-10-21 SURGERY — Surgical Case
Anesthesia: Spinal

## 2022-10-21 MED ORDER — TETANUS-DIPHTH-ACELL PERTUSSIS 5-2.5-18.5 LF-MCG/0.5 IM SUSY: 0.5000 mL | PREFILLED_SYRINGE | Freq: Once | INTRAMUSCULAR | Status: DC

## 2022-10-21 MED ORDER — MAGNESIUM HYDROXIDE 400 MG/5ML PO SUSP: 30.0000 mL | ORAL | Status: DC | PRN

## 2022-10-21 MED ORDER — MENTHOL 3 MG MT LOZG: 1.0000 | LOZENGE | OROMUCOSAL | Status: DC | PRN

## 2022-10-21 MED ORDER — METHYLERGONOVINE MALEATE 0.2 MG/ML IJ SOLN: 0.2000 mg | INTRAMUSCULAR | Status: DC | PRN

## 2022-10-21 MED ORDER — ONDANSETRON HCL 4 MG/2ML IJ SOLN: 4.0000 mg | Freq: Once | INTRAMUSCULAR | Status: DC | PRN

## 2022-10-21 MED ORDER — DIBUCAINE (PERIANAL) 1 % EX OINT: 1.0000 | TOPICAL_OINTMENT | CUTANEOUS | Status: DC | PRN

## 2022-10-21 MED ORDER — OXYTOCIN-SODIUM CHLORIDE 30-0.9 UT/500ML-% IV SOLN
INTRAVENOUS | Status: DC | PRN
  Administered 2022-10-21: 300 mL via INTRAVENOUS

## 2022-10-21 MED ORDER — BUPIVACAINE IN DEXTROSE 0.75-8.25 % IT SOLN
INTRATHECAL | Status: DC | PRN
  Administered 2022-10-21: 1.5 mL via INTRATHECAL

## 2022-10-21 MED ORDER — ZOLPIDEM TARTRATE 5 MG PO TABS: 5.0000 mg | ORAL_TABLET | Freq: Every evening | ORAL | Status: DC | PRN

## 2022-10-21 MED ORDER — KETOROLAC TROMETHAMINE 30 MG/ML IJ SOLN: 30.0000 mg | Freq: Four times a day (QID) | INTRAMUSCULAR | Status: DC | PRN

## 2022-10-21 MED ORDER — OXYTOCIN-SODIUM CHLORIDE 30-0.9 UT/500ML-% IV SOLN
2.5000 [IU]/h | INTRAVENOUS | Status: AC
  Administered 2022-10-21: 2.5 [IU]/h via INTRAVENOUS
  Filled 2022-10-21: qty 500

## 2022-10-21 MED ORDER — ACETAMINOPHEN 500 MG PO TABS
1000.0000 mg | ORAL_TABLET | Freq: Four times a day (QID) | ORAL | Status: DC
  Administered 2022-10-21 – 2022-10-23 (×7): 1000 mg via ORAL
  Filled 2022-10-21 (×8): qty 2

## 2022-10-21 MED ORDER — MORPHINE SULFATE (PF) 0.5 MG/ML IJ SOLN
INTRAMUSCULAR | Status: AC
  Filled 2022-10-21: qty 10

## 2022-10-21 MED ORDER — KETOROLAC TROMETHAMINE 30 MG/ML IJ SOLN
30.0000 mg | Freq: Four times a day (QID) | INTRAMUSCULAR | Status: AC
  Administered 2022-10-21 – 2022-10-22 (×3): 30 mg via INTRAVENOUS
  Filled 2022-10-21 (×3): qty 1

## 2022-10-21 MED ORDER — DIPHENHYDRAMINE HCL 50 MG/ML IJ SOLN: 12.5000 mg | INTRAMUSCULAR | Status: DC | PRN

## 2022-10-21 MED ORDER — OXYTOCIN-SODIUM CHLORIDE 30-0.9 UT/500ML-% IV SOLN
INTRAVENOUS | Status: AC
  Filled 2022-10-21: qty 500

## 2022-10-21 MED ORDER — HYDROMORPHONE HCL 1 MG/ML IJ SOLN: 0.2500 mg | INTRAMUSCULAR | Status: DC | PRN

## 2022-10-21 MED ORDER — SCOPOLAMINE 1 MG/3DAYS TD PT72: 1.0000 | MEDICATED_PATCH | Freq: Once | TRANSDERMAL | Status: DC

## 2022-10-21 MED ORDER — OXYCODONE HCL 5 MG/5ML PO SOLN: 5.0000 mg | Freq: Once | ORAL | Status: DC | PRN

## 2022-10-21 MED ORDER — IBUPROFEN 600 MG PO TABS
600.0000 mg | ORAL_TABLET | Freq: Four times a day (QID) | ORAL | Status: DC
  Administered 2022-10-22 – 2022-10-23 (×5): 600 mg via ORAL
  Filled 2022-10-21 (×7): qty 1

## 2022-10-21 MED ORDER — WITCH HAZEL-GLYCERIN EX PADS: 1.0000 | MEDICATED_PAD | CUTANEOUS | Status: DC | PRN

## 2022-10-21 MED ORDER — SODIUM CHLORIDE 0.9% FLUSH: 3.0000 mL | INTRAVENOUS | Status: DC | PRN

## 2022-10-21 MED ORDER — OXYCODONE HCL 5 MG PO TABS: 5.0000 mg | ORAL_TABLET | Freq: Once | ORAL | Status: DC | PRN

## 2022-10-21 MED ORDER — DIPHENHYDRAMINE HCL 25 MG PO CAPS: 25.0000 mg | ORAL_CAPSULE | ORAL | Status: DC | PRN

## 2022-10-21 MED ORDER — PHENYLEPHRINE HCL-NACL 20-0.9 MG/250ML-% IV SOLN
INTRAVENOUS | Status: DC | PRN
  Administered 2022-10-21: 60 ug/min via INTRAVENOUS

## 2022-10-21 MED ORDER — NALOXONE HCL 4 MG/10ML IJ SOLN: 1.0000 ug/kg/h | INTRAVENOUS | Status: DC | PRN

## 2022-10-21 MED ORDER — CEFAZOLIN SODIUM-DEXTROSE 2-4 GM/100ML-% IV SOLN
2.0000 g | INTRAVENOUS | Status: AC
  Administered 2022-10-21: 2 g via INTRAVENOUS

## 2022-10-21 MED ORDER — ONDANSETRON HCL 4 MG/2ML IJ SOLN
INTRAMUSCULAR | Status: AC
  Filled 2022-10-21: qty 2

## 2022-10-21 MED ORDER — ACETAMINOPHEN 500 MG PO TABS: 1000.0000 mg | ORAL_TABLET | Freq: Four times a day (QID) | ORAL | Status: DC

## 2022-10-21 MED ORDER — MEPERIDINE HCL 25 MG/ML IJ SOLN: 6.2500 mg | INTRAMUSCULAR | Status: DC | PRN

## 2022-10-21 MED ORDER — ONDANSETRON HCL 4 MG/2ML IJ SOLN
INTRAMUSCULAR | Status: DC | PRN
  Administered 2022-10-21: 4 mg via INTRAVENOUS

## 2022-10-21 MED ORDER — STERILE WATER FOR IRRIGATION IR SOLN
Status: DC | PRN
  Administered 2022-10-21: 1

## 2022-10-21 MED ORDER — SIMETHICONE 80 MG PO CHEW: 80.0000 mg | CHEWABLE_TABLET | ORAL | Status: DC | PRN

## 2022-10-21 MED ORDER — CEFAZOLIN SODIUM-DEXTROSE 2-4 GM/100ML-% IV SOLN
INTRAVENOUS | Status: AC
  Filled 2022-10-21: qty 100

## 2022-10-21 MED ORDER — LACTATED RINGERS IV SOLN: INTRAVENOUS | Status: DC

## 2022-10-21 MED ORDER — DEXAMETHASONE SODIUM PHOSPHATE 4 MG/ML IJ SOLN
INTRAMUSCULAR | Status: AC
  Filled 2022-10-21: qty 1

## 2022-10-21 MED ORDER — PHENYLEPHRINE HCL-NACL 20-0.9 MG/250ML-% IV SOLN
INTRAVENOUS | Status: AC
  Filled 2022-10-21: qty 250

## 2022-10-21 MED ORDER — METHYLERGONOVINE MALEATE 0.2 MG PO TABS: 0.2000 mg | ORAL_TABLET | ORAL | Status: DC | PRN

## 2022-10-21 MED ORDER — KETOROLAC TROMETHAMINE 30 MG/ML IJ SOLN: 30.0000 mg | Freq: Once | INTRAMUSCULAR | Status: DC | PRN

## 2022-10-21 MED ORDER — AMISULPRIDE (ANTIEMETIC) 5 MG/2ML IV SOLN: 10.0000 mg | Freq: Once | INTRAVENOUS | Status: DC | PRN

## 2022-10-21 MED ORDER — DEXAMETHASONE SODIUM PHOSPHATE 10 MG/ML IJ SOLN
INTRAMUSCULAR | Status: DC | PRN
  Administered 2022-10-21: 4 mg via INTRAVENOUS

## 2022-10-21 MED ORDER — NALOXONE HCL 0.4 MG/ML IJ SOLN: 0.4000 mg | INTRAMUSCULAR | Status: DC | PRN

## 2022-10-21 MED ORDER — ONDANSETRON HCL 4 MG/2ML IJ SOLN: 4.0000 mg | Freq: Three times a day (TID) | INTRAMUSCULAR | Status: DC | PRN

## 2022-10-21 MED ORDER — MORPHINE SULFATE (PF) 0.5 MG/ML IJ SOLN
INTRAMUSCULAR | Status: DC | PRN
  Administered 2022-10-21: 150 ug via INTRATHECAL

## 2022-10-21 MED ORDER — ALBUTEROL SULFATE (2.5 MG/3ML) 0.083% IN NEBU: 2.5000 mg | INHALATION_SOLUTION | Freq: Four times a day (QID) | RESPIRATORY_TRACT | Status: DC | PRN

## 2022-10-21 MED ORDER — DIPHENHYDRAMINE HCL 25 MG PO CAPS: 25.0000 mg | ORAL_CAPSULE | Freq: Four times a day (QID) | ORAL | Status: DC | PRN

## 2022-10-21 MED ORDER — OXYCODONE HCL 5 MG PO TABS: 5.0000 mg | ORAL_TABLET | ORAL | Status: DC | PRN

## 2022-10-21 MED ORDER — SENNOSIDES-DOCUSATE SODIUM 8.6-50 MG PO TABS
2.0000 | ORAL_TABLET | ORAL | Status: DC
  Administered 2022-10-21 – 2022-10-22 (×2): 2 via ORAL
  Filled 2022-10-21 (×2): qty 2

## 2022-10-21 MED ORDER — FENTANYL CITRATE (PF) 100 MCG/2ML IJ SOLN
INTRAMUSCULAR | Status: AC
  Filled 2022-10-21: qty 2

## 2022-10-21 MED ORDER — FENTANYL CITRATE (PF) 100 MCG/2ML IJ SOLN
INTRAMUSCULAR | Status: DC | PRN
  Administered 2022-10-21: 15 ug via INTRATHECAL

## 2022-10-21 MED ORDER — POVIDONE-IODINE 10 % EX SWAB: 2.0000 | Freq: Once | CUTANEOUS | Status: DC

## 2022-10-21 MED ORDER — COCONUT OIL OIL
1.0000 | TOPICAL_OIL | Status: DC | PRN
  Administered 2022-10-22 – 2022-10-23 (×2): 1 via TOPICAL

## 2022-10-21 MED ORDER — PRENATAL MULTIVITAMIN CH
1.0000 | ORAL_TABLET | Freq: Every day | ORAL | Status: DC
  Administered 2022-10-21 – 2022-10-23 (×3): 1 via ORAL
  Filled 2022-10-21 (×3): qty 1

## 2022-10-21 MED ORDER — MEASLES, MUMPS & RUBELLA VAC IJ SOLR: 0.5000 mL | Freq: Once | INTRAMUSCULAR | Status: DC

## 2022-10-21 SURGICAL SUPPLY — 32 items
APL PRP STRL LF DISP 70% ISPRP (MISCELLANEOUS) ×2
APL SKNCLS STERI-STRIP NONHPOA (GAUZE/BANDAGES/DRESSINGS) ×1
BENZOIN TINCTURE PRP APPL 2/3 (GAUZE/BANDAGES/DRESSINGS) IMPLANT
CHLORAPREP W/TINT 26 (MISCELLANEOUS) ×2 IMPLANT
CLAMP UMBILICAL CORD (MISCELLANEOUS) ×1 IMPLANT
CLOTH BEACON ORANGE TIMEOUT ST (SAFETY) ×1 IMPLANT
DRSG OPSITE POSTOP 4X10 (GAUZE/BANDAGES/DRESSINGS) ×1 IMPLANT
ELECT REM PT RETURN 9FT ADLT (ELECTROSURGICAL) ×1
ELECTRODE REM PT RTRN 9FT ADLT (ELECTROSURGICAL) ×1 IMPLANT
EXTRACTOR VACUUM KIWI (MISCELLANEOUS) IMPLANT
EXTRACTOR VACUUM M CUP 4 TUBE (SUCTIONS) IMPLANT
GLOVE BIOGEL PI IND STRL 7.0 (GLOVE) ×1 IMPLANT
GLOVE ORTHO TXT STRL SZ7.5 (GLOVE) ×1 IMPLANT
GOWN STRL REUS W/TWL LRG LVL3 (GOWN DISPOSABLE) ×2 IMPLANT
KIT ABG SYR 3ML LUER SLIP (SYRINGE) IMPLANT
NDL HYPO 25X5/8 SAFETYGLIDE (NEEDLE) ×1 IMPLANT
NEEDLE HYPO 25X5/8 SAFETYGLIDE (NEEDLE) ×1 IMPLANT
NS IRRIG 1000ML POUR BTL (IV SOLUTION) ×1 IMPLANT
PACK C SECTION WH (CUSTOM PROCEDURE TRAY) ×1 IMPLANT
PAD OB MATERNITY 4.3X12.25 (PERSONAL CARE ITEMS) ×1 IMPLANT
RTRCTR C-SECT PINK 25CM LRG (MISCELLANEOUS) ×1 IMPLANT
STRIP CLOSURE SKIN 1/2X4 (GAUZE/BANDAGES/DRESSINGS) IMPLANT
SUT CHROMIC 1 CTX 36 (SUTURE) ×2 IMPLANT
SUT PLAIN 0 NONE (SUTURE) IMPLANT
SUT PLAIN 2 0 XLH (SUTURE) IMPLANT
SUT VIC AB 0 CT1 27 (SUTURE) ×2
SUT VIC AB 0 CT1 27XBRD ANBCTR (SUTURE) ×2 IMPLANT
SUT VIC AB 2-0 CT1 (SUTURE) ×1 IMPLANT
SUT VIC AB 4-0 KS 27 (SUTURE) IMPLANT
TOWEL OR 17X24 6PK STRL BLUE (TOWEL DISPOSABLE) ×1 IMPLANT
TRAY FOLEY W/BAG SLVR 14FR LF (SET/KITS/TRAYS/PACK) ×1 IMPLANT
WATER STERILE IRR 1000ML POUR (IV SOLUTION) ×1 IMPLANT

## 2022-10-21 NOTE — Transfer of Care (Signed)
Immediate Anesthesia Transfer of Care Note  Patient: Crystal Soto  Procedure(s) Performed: CESAREAN SECTION  Patient Location: PACU  Anesthesia Type:Spinal  Level of Consciousness: awake  Airway & Oxygen Therapy: Patient Spontanous Breathing  Post-op Assessment: Report given to RN and Post -op Vital signs reviewed and stable  Post vital signs: Reviewed and stable  Last Vitals:  Vitals Value Taken Time  BP 105/59 10/21/22 0851  Temp    Pulse 98 10/21/22 0852  Resp 23 10/21/22 0852  SpO2 97 % 10/21/22 0852  Vitals shown include unfiled device data.  Last Pain:  Vitals:   10/21/22 0543  TempSrc: Oral         Complications: No notable events documented.

## 2022-10-21 NOTE — Interval H&P Note (Signed)
History and Physical Interval Note:  10/21/2022 7:19 AM  Crystal Soto  has presented today for surgery, with the diagnosis of history of myomectomy.  The various methods of treatment have been discussed with the patient and family. After consideration of risks, benefits and other options for treatment, the patient has consented to  Procedure(s) with comments: CESAREAN SECTION (N/A) - request OB Fellow assist as a surgical intervention.  The patient's history has been reviewed, patient examined, no change in status, stable for surgery.  I have reviewed the patient's chart and labs.  Questions were answered to the patient's satisfaction.     Leighton Roach Kass Herberger

## 2022-10-21 NOTE — Op Note (Signed)
Preoperative diagnosis: Intrauterine pregnancy at 37 weeks, previous myomectomy Postoperative diagnosis: Same Procedure: Primary low transverse cesarean section without extensions Surgeon: Lavina Hamman M.D. Anesthesia: Spinal  Findings: Patient had normal gravid anatomy except for a 7-8 cm posterior subserosal myoma, and delivered a viable female infant with Apgars of 9 and 9 weight pending Estimated blood loss: 1650 cc Specimens: Placenta sent to labor and delivery Complications: None  Procedure in detail: The patient was taken to the operating room and placed in the sitting position. The anesthesiologist instilled spinal anesthesia.  She was then placed in the dorsosupine position with left tilt. Abdomen was then prepped and draped in the usual sterile fashion, and a foley catheter was inserted. The level of her anesthesia was found to be adequate. Abdomen was entered via a standard Pfannenstiel incision. Once the peritoneal cavity was entered the Alexis disposable self-retaining retractor was placed and good visualization was achieved. A 4 cm transverse incision was then made in the lower uterine segment pushing the bladder inferior. Once the uterine cavity was entered the incision was extended digitally, clear amniotic fluid. The fetal vertex was grasped and delivered through the incision atraumatically. Mouth and nares were suctioned. The remainder of the infant then delivered atraumatically. Cord was doubly clamped and cut after one minute and the infant handed to the awaiting pediatric team. Cord blood was obtained. The placenta delivered spontaneously. Uterus was wiped dry with clean lap pad and all clots and debris were removed. Uterine incision was inspected and found to be free of extensions. Uterine incision was closed in 1 layer with running locking #1 Chromic. Tubes and ovaries were inspected and found to be normal. Uterine incision was inspected and found to be hemostatic. Bleeding from  serosal edges was controlled with electrocautery. The Alexis retractor was removed. Subfascial space was irrigated and made hemostatic with electrocautery.  Fascia was closed in running fashion starting at both ends and meeting in the middle with 0 Vicryl. Subcutaneous tissue was then irrigated and made hemostatic with electrocautery, then closed with running 2-0 plain gut. Skin was closed with running 4-0 Vicryl subcuticular suture followed by steri-strips and a sterile dressing. Patient tolerated the procedure well and was taken to the recovery in stable condition. Counts were correct x2, she received Ancef 2 g IV at the beginning of the procedure and she had PAS hose on throughout the procedure.

## 2022-10-21 NOTE — Anesthesia Postprocedure Evaluation (Signed)
Anesthesia Post Note  Patient: Crystal Soto  Procedure(s) Performed: CESAREAN SECTION     Patient location during evaluation: PACU Anesthesia Type: Spinal Level of consciousness: awake and alert and oriented Pain management: pain level controlled Vital Signs Assessment: post-procedure vital signs reviewed and stable Respiratory status: spontaneous breathing, nonlabored ventilation and respiratory function stable Cardiovascular status: blood pressure returned to baseline and stable Postop Assessment: no headache, no backache, spinal receding, patient able to bend at knees and no apparent nausea or vomiting Anesthetic complications: no   No notable events documented.  Last Vitals:  Vitals:   10/21/22 0915 10/21/22 0930  BP: 106/68 118/77  Pulse: 91 80  Resp: 20 14  Temp: 36.7 C   SpO2: 96% 97%    Last Pain:  Vitals:   10/21/22 0915  TempSrc: Axillary  PainSc: 2    Pain Goal: Patients Stated Pain Goal: 4 (10/21/22 0915)  LLE Motor Response: Non-purposeful movement (10/21/22 0915) LLE Sensation: Tingling (10/21/22 0915) RLE Motor Response: Non-purposeful movement (10/21/22 0915) RLE Sensation: Tingling (10/21/22 0915) L Sensory Level: L3-Anterior knee, lower leg (10/21/22 0915) R Sensory Level: L3-Anterior knee, lower leg (10/21/22 0915) Epidural/Spinal Function Cutaneous sensation: Able to Wiggle Toes (10/21/22 0915), Patient able to flex knees: Yes (10/21/22 0915), Patient able to lift hips off bed: Yes (10/21/22 0915), Back pain beyond tenderness at insertion site: No (10/21/22 0915), Progressively worsening motor and/or sensory loss: No (10/21/22 0915), Bowel and/or bladder incontinence post epidural: No (10/21/22 0915)  Lannie Fields

## 2022-10-21 NOTE — Lactation Note (Addendum)
This note was copied from a baby's chart. Lactation Consultation Note  Patient Name: Crystal Soto RUEAV'W Date: 10/21/2022 Age:27 hours Reason for consult: Initial assessment;1st time breastfeeding;Early term 37-38.6wks  P1, [redacted]w[redacted]d.  Request for assistance with feeding.  Reviewed hand expression. Mother has plentiful amount of colostrum at this time.  Gave baby approximately 7 ml on spoon to interest her in feeding.  After baby latched in football hold for 10 min.  It took a few attempts for her to latch and needed stimulation to continue feeding. Set up DEBP with 21 mm flanges.  Plan: Keep baby STS as much as possible  Offer breast when baby cues that she is hungry, or awaken baby for feeding at 3 hrs.  Breast feed baby, asking for help prn.   If baby does not latch after 10 min of attempt - give supplemental breastmilk.  Slow flow nipple bottle is an option.   Pump both breasts 15 minutes on initiation setting, adding hand expression to collect as much colostrum as possible to feed baby.  Feed baby supplemental breastmilk before or after breastfeeding.    Maternal Data Has patient been taught Hand Expression?: Yes Does the patient have breastfeeding experience prior to this delivery?: No  Feeding Mother's Current Feeding Choice: Breast Milk  LATCH Score Latch: Repeated attempts needed to sustain latch, nipple held in mouth throughout feeding, stimulation needed to elicit sucking reflex.  Audible Swallowing: A few with stimulation  Type of Nipple: Everted at rest and after stimulation  Comfort (Breast/Nipple): Soft / non-tender  Hold (Positioning): Assistance needed to correctly position infant at breast and maintain latch.  LATCH Score: 7   Lactation Tools Discussed/Used Tools: Pump;Flanges Breast pump type: Double-Electric Breast Pump;Manual Pump Education: Setup, frequency, and cleaning;Milk Storage Reason for Pumping: stimulation and supplementation Pumping  frequency:  (q 3 hours)  Interventions Interventions: Breast feeding basics reviewed;Assisted with latch;Skin to skin;Hand express;Adjust position;Support pillows;DEBP;Education  Discharge Pump: Personal;DEBP Chiropractor employee)  Consult Status Consult Status: Follow-up Date: 10/22/22 Follow-up type: In-patient    Hardie Pulley  RN [facility] 10/21/2022, 3:49 PM

## 2022-10-21 NOTE — Anesthesia Procedure Notes (Signed)
Spinal  Patient location during procedure: OR Start time: 10/21/2022 7:30 AM End time: 10/21/2022 7:34 AM Reason for block: surgical anesthesia Staffing Performed: anesthesiologist  Anesthesiologist: Lannie Fields, DO Performed by: Lannie Fields, DO Authorized by: Lannie Fields, DO   Preanesthetic Checklist Completed: patient identified, IV checked, risks and benefits discussed, surgical consent, monitors and equipment checked, pre-op evaluation and timeout performed Spinal Block Patient position: sitting Prep: DuraPrep and site prepped and draped Patient monitoring: cardiac monitor, continuous pulse ox and blood pressure Approach: midline Location: L3-4 Injection technique: single-shot Needle Needle type: Pencan  Needle gauge: 24 G Needle length: 9 cm Assessment Sensory level: T6 Events: CSF return Additional Notes Functioning IV was confirmed and monitors were applied. Sterile prep and drape, including hand hygiene and sterile gloves were used. The patient was positioned and the spine was prepped. The skin was anesthetized with lidocaine.  Free flow of clear CSF was obtained prior to injecting local anesthetic into the CSF.  The spinal needle aspirated freely following injection.  The needle was carefully withdrawn.  The patient tolerated the procedure well.

## 2022-10-22 MED ORDER — FERROUS SULFATE 325 (65 FE) MG PO TABS
325.0000 mg | ORAL_TABLET | Freq: Every day | ORAL | Status: DC
  Administered 2022-10-22 – 2022-10-23 (×2): 325 mg via ORAL
  Filled 2022-10-22 (×2): qty 1

## 2022-10-22 NOTE — Progress Notes (Signed)
Subjective: Postpartum Day 1: Cesarean Delivery Patient is doing well this morning. Pain is controlled. Ambulating, voiding, tolerating PO. Minimal lochia. + flatus and bowel movement.  Breastfeeding.   Objective: Patient Vitals for the past 24 hrs:  BP Temp Temp src Pulse Resp SpO2  10/22/22 0500 118/62 98 F (36.7 C) Oral 77 16 99 %  10/21/22 2045 125/70 98.2 F (36.8 C) Oral 81 16 100 %  10/21/22 1750 128/76 98.7 F (37.1 C) Axillary 80 16 99 %  10/21/22 1400 -- 97.6 F (36.4 C) Axillary -- 16 97 %  10/21/22 1200 126/84 98.8 F (37.1 C) Axillary 77 18 100 %  10/21/22 1100 121/74 97.8 F (36.6 C) Oral 76 18 100 %  10/21/22 1000 121/73 97.8 F (36.6 C) Oral 70 20 100 %  10/21/22 0945 120/68 -- -- 67 (!) 22 100 %  10/21/22 0930 118/77 -- -- 80 14 97 %  10/21/22 0915 106/68 98.1 F (36.7 C) Axillary 91 20 96 %  10/21/22 0900 106/61 -- -- 90 (!) 21 98 %     Physical Exam:  General: alert, cooperative, and no distress Lochia: appropriate Uterine Fundus: firm Incision: healing well, no significant drainage, no dehiscence, no significant erythema DVT Evaluation: No evidence of DVT seen on physical exam.  Recent Labs    10/21/22 1447 10/22/22 0541  HGB 10.5* 9.7*  HCT 31.2* 29.3*    Assessment/Plan: Crystal Soto G1P1001 POD#1 sp cesarean at [redacted]w[redacted]d 1. PPC: routine PP care 2. ABLA, clinically significant: PO iron, asymptomatic 3. Rh pos 4. Dispo: anticipate discharge POD#2 or 3  Crystal Soto 10/22/2022, 8:55 AM

## 2022-10-23 ENCOUNTER — Other Ambulatory Visit: Payer: Self-pay

## 2022-10-23 MED ORDER — IBUPROFEN 600 MG PO TABS
600.0000 mg | ORAL_TABLET | Freq: Four times a day (QID) | ORAL | 0 refills | Status: DC
  Filled 2022-10-23: qty 30, 8d supply, fill #0

## 2022-10-23 NOTE — Discharge Instructions (Signed)
As per discharge pamphlet °

## 2022-10-23 NOTE — Discharge Summary (Signed)
Postpartum Discharge Summary      Patient Name: Crystal Soto DOB: Jan 26, 1996 MRN: [id number]  Date of admission: 10/21/2022 Delivery date:10/21/2022 Delivering provider: Jackelyn Knife, Kemoni Quesenberry Date of discharge: 10/23/2022  Admitting diagnosis: Uterine scar from previous surgery affecting pregnancy [O34.29] S/P cesarean section [Z98.891] Intrauterine pregnancy: [redacted]w[redacted]d     Secondary diagnosis:  Principal Problem:   Uterine scar from previous surgery affecting pregnancy Active Problems:   S/P cesarean section     Discharge diagnosis: Term Pregnancy Delivered and previous myomectomy                                                Hospital course: Sceduled C/S   27 y.o. yo G1P1001 at [redacted]w[redacted]d was admitted to the hospital 10/21/2022 for scheduled cesarean section with the following indication:Prior Uterine Surgery.Delivery details are as follows:  Membrane Rupture Time/Date: 7:59 AM,10/21/2022  Delivery Method:C-Section, Low Transverse Details of operation can be found in separate operative note.  Patient had a postpartum course complicated by nothing.  She is ambulating, tolerating a regular diet, passing flatus, and urinating well. Patient is discharged home in stable condition on  10/23/22        Newborn Data: Birth date:10/21/2022 Birth time:8:00 AM Gender:Female Living status:Living Apgars:9 ,9  Weight:3020 g     Physical exam  Vitals:   10/22/22 0500 10/22/22 1305 10/22/22 2118 10/23/22 0616  BP: 118/62 124/84 116/71 136/87  Pulse: 77 93 95 88  Resp: 16 17 16 18   Temp: 98 F (36.7 C) 98.5 F (36.9 C) 98.1 F (36.7 C) 98 F (36.7 C)  TempSrc: Oral Oral Oral Oral  SpO2: 99% 99% 98% 97%  Weight:      Height:       General: alert Lochia: appropriate Uterine Fundus: firm Incision: Healing well with no significant drainage  Labs: Lab Results  Component Value Date   WBC 11.1 (H) 10/22/2022   HGB 9.7 (L) 10/22/2022   HCT 29.3 (L) 10/22/2022   MCV 84.2 10/22/2022   PLT  162 10/22/2022      Latest Ref Rng & Units 10/07/2021    8:31 AM  CMP  Total Protein 6.0 - 8.3 g/dL 7.1   Total Bilirubin 0.2 - 1.2 mg/dL 1.0   Alkaline Phos 39 - 117 U/L 51   AST 0 - 37 U/L 21   ALT 0 - 35 U/L 19    Edinburgh Score:    10/21/2022   11:00 AM  Edinburgh Postnatal Depression Scale Screening Tool  I have been able to laugh and see the funny side of things. 0  I have looked forward with enjoyment to things. 0  I have blamed myself unnecessarily when things went wrong. 1  I have been anxious or worried for no good reason. 1  I have felt scared or panicky for no good reason. 0  Things have been getting on top of me. 0  I have been so unhappy that I have had difficulty sleeping. 0  I have felt sad or miserable. 0  I have been so unhappy that I have been crying. 0  The thought of harming myself has occurred to me. 0  Edinburgh Postnatal Depression Scale Total 2      After visit meds:  Allergies as of 10/23/2022   No Known Allergies      Medication  List     TAKE these medications    acetaminophen 325 MG tablet Commonly known as: TYLENOL Take 650 mg by mouth every 6 (six) hours as needed for mild pain or moderate pain.   albuterol 108 (90 Base) MCG/ACT inhaler Commonly known as: VENTOLIN HFA Inhale 2 puffs into the lungs every 6 (six) hours as needed for wheezing or shortness of breath.   CHOLINE PO Take 110 mg by mouth daily.   Colace 100 MG capsule Generic drug: docusate sodium Take 100 mg by mouth daily as needed for mild constipation or moderate constipation.   famotidine 20 MG tablet Commonly known as: PEPCID Take 20 mg by mouth daily as needed for heartburn or indigestion.   ibuprofen 600 MG tablet Commonly known as: ADVIL Take 1 tablet (600 mg total) by mouth every 6 (six) hours.   loratadine 10 MG tablet Commonly known as: CLARITIN Take 10 mg by mouth daily as needed for allergies.   ondansetron 4 MG disintegrating tablet Commonly  known as: ZOFRAN-ODT Take 1 tablet (4 mg total) by mouth every 8 (eight) hours as needed for nausea or vomiting.   ONE A DAY PRENATAL PO Take 1 tablet by mouth daily.   Tums Ultra 1000 1000 MG chewable tablet Generic drug: calcium elemental as carbonate Chew 1,000 mg by mouth daily as needed for heartburn.         Discharge home in stable condition Infant Feeding: Breast Infant Disposition:home with mother Discharge instruction: per After Visit Summary and Postpartum booklet. Activity: Advance as tolerated. Pelvic rest for 6 weeks.  Diet: routine diet Postpartum Appointment:2 weeks Future Appointments: Future Appointments  Date Time Provider Department Center  09/16/2023  8:00 AM Wendling, Jilda Roche, DO [facility]   Follow up Visit:  Follow-up Information     Julyanna Scholle, MD. Schedule an appointment as soon as possible for a visit in 2 week(s).   Specialty: Obstetrics and Gynecology Why: for incision check Contact information: [address] [place] [address] [phone]                     10/23/2022 Zenaida Niece, MD

## 2022-10-23 NOTE — Progress Notes (Signed)
POD #2 LTCS Doing well, mild pain, bleeding slowing Afeb, VSS Abd- soft, fundus firm, incision intact D/c home

## 2022-10-24 ENCOUNTER — Other Ambulatory Visit (HOSPITAL_BASED_OUTPATIENT_CLINIC_OR_DEPARTMENT_OTHER): Payer: Self-pay

## 2022-10-25 ENCOUNTER — Other Ambulatory Visit (HOSPITAL_BASED_OUTPATIENT_CLINIC_OR_DEPARTMENT_OTHER): Payer: Self-pay

## 2022-11-10 ENCOUNTER — Other Ambulatory Visit (HOSPITAL_BASED_OUTPATIENT_CLINIC_OR_DEPARTMENT_OTHER): Payer: Self-pay

## 2022-11-16 ENCOUNTER — Telehealth (HOSPITAL_COMMUNITY): Payer: Self-pay | Admitting: *Deleted

## 2022-11-16 NOTE — Telephone Encounter (Signed)
11/16/2022  Name: Crystal Soto MRN: [id number] DOB: July 14, 1995  Reason for Call:  Transition of Care Hospital Discharge Call  Contact Status: Patient Contact Status: Complete  Language assistant needed: Interpreter Mode: Interpreter Not Needed        Follow-Up Questions: Do You Have Any Concerns About Your Health As You Heal From Delivery?: No Do You Have Any Concerns About Your Infants Health?: No  Edinburgh Postnatal Depression Scale:  In the Past 7 Days: I have been able to laugh and see the funny side of things.: As much as I always could I have looked forward with enjoyment to things.: As much as I ever did I have blamed myself unnecessarily when things went wrong.: No, never I have been anxious or worried for no good reason.: No, not at all I have felt scared or panicky for no good reason.: No, not at all Things have been getting on top of me.: No, most of the time I have coped quite well I have been so unhappy that I have had difficulty sleeping.: Not at all I have felt sad or miserable.: No, not at all I have been so unhappy that I have been crying.: No, never The thought of harming myself has occurred to me.: Never Inocente Salles Postnatal Depression Scale Total: 1  PHQ2-9 Depression Scale:     Discharge Follow-up: Edinburgh score requires follow up?: No Patient was advised of the following resources:: Breastfeeding Support Group, Support Group  Post-discharge interventions: Reviewed Newborn Safe Sleep Practices  Salena Saner, RN 11/16/2022 14:04

## 2022-12-02 DIAGNOSIS — Z1151 Encounter for screening for human papillomavirus (HPV): Secondary | ICD-10-CM | POA: Diagnosis not present

## 2022-12-02 DIAGNOSIS — Z124 Encounter for screening for malignant neoplasm of cervix: Secondary | ICD-10-CM | POA: Diagnosis not present

## 2022-12-14 DIAGNOSIS — Z30430 Encounter for insertion of intrauterine contraceptive device: Secondary | ICD-10-CM | POA: Diagnosis not present

## 2022-12-28 DIAGNOSIS — M9904 Segmental and somatic dysfunction of sacral region: Secondary | ICD-10-CM | POA: Diagnosis not present

## 2022-12-28 DIAGNOSIS — M9901 Segmental and somatic dysfunction of cervical region: Secondary | ICD-10-CM | POA: Diagnosis not present

## 2022-12-28 DIAGNOSIS — M9902 Segmental and somatic dysfunction of thoracic region: Secondary | ICD-10-CM | POA: Diagnosis not present

## 2022-12-28 DIAGNOSIS — M9903 Segmental and somatic dysfunction of lumbar region: Secondary | ICD-10-CM | POA: Diagnosis not present

## 2022-12-29 DIAGNOSIS — M9901 Segmental and somatic dysfunction of cervical region: Secondary | ICD-10-CM | POA: Diagnosis not present

## 2022-12-29 DIAGNOSIS — M9903 Segmental and somatic dysfunction of lumbar region: Secondary | ICD-10-CM | POA: Diagnosis not present

## 2022-12-29 DIAGNOSIS — M9902 Segmental and somatic dysfunction of thoracic region: Secondary | ICD-10-CM | POA: Diagnosis not present

## 2022-12-29 DIAGNOSIS — M9904 Segmental and somatic dysfunction of sacral region: Secondary | ICD-10-CM | POA: Diagnosis not present

## 2022-12-30 DIAGNOSIS — M9902 Segmental and somatic dysfunction of thoracic region: Secondary | ICD-10-CM | POA: Diagnosis not present

## 2022-12-30 DIAGNOSIS — M9903 Segmental and somatic dysfunction of lumbar region: Secondary | ICD-10-CM | POA: Diagnosis not present

## 2022-12-30 DIAGNOSIS — M9901 Segmental and somatic dysfunction of cervical region: Secondary | ICD-10-CM | POA: Diagnosis not present

## 2022-12-30 DIAGNOSIS — M9904 Segmental and somatic dysfunction of sacral region: Secondary | ICD-10-CM | POA: Diagnosis not present

## 2022-12-31 ENCOUNTER — Other Ambulatory Visit (HOSPITAL_BASED_OUTPATIENT_CLINIC_OR_DEPARTMENT_OTHER): Payer: Self-pay

## 2022-12-31 ENCOUNTER — Telehealth: Payer: 59 | Admitting: Physician Assistant

## 2022-12-31 DIAGNOSIS — N61 Mastitis without abscess: Secondary | ICD-10-CM

## 2022-12-31 MED ORDER — CEPHALEXIN 500 MG PO CAPS
500.0000 mg | ORAL_CAPSULE | Freq: Four times a day (QID) | ORAL | 0 refills | Status: AC
Start: 2022-12-31 — End: 2023-01-10
  Filled 2022-12-31: qty 40, 10d supply, fill #0

## 2022-12-31 NOTE — Progress Notes (Signed)
Virtual Visit Consent   Crystal Soto, you are scheduled for a virtual visit with a [facility] provider today. Just as with appointments in the office, your consent must be obtained to participate. Your consent will be active for this visit and any virtual visit you may have with one of our providers in the next 365 days. If you have a MyChart account, a copy of this consent can be sent to you electronically.  As this is a virtual visit, video technology does not allow for your provider to perform a traditional examination. This may limit your provider's ability to fully assess your condition. If your provider identifies any concerns that need to be evaluated in person or the need to arrange testing (such as labs, EKG, etc.), we will make arrangements to do so. Although advances in technology are sophisticated, we cannot ensure that it will always work on either your end or our end. If the connection with a video visit is poor, the visit may have to be switched to a telephone visit. With either a video or telephone visit, we are not always able to ensure that we have a secure connection.  By engaging in this virtual visit, you consent to the provision of healthcare and authorize for your insurance to be billed (if applicable) for the services provided during this visit. Depending on your insurance coverage, you may receive a charge related to this service.  I need to obtain your verbal consent now. Are you willing to proceed with your visit today? Crystal Soto has provided verbal consent on 12/31/2022 for a virtual visit (video or telephone). Margaretann Loveless, PA-C  Date: 12/31/2022 2:52 PM  Virtual Visit via Video Note   I, Margaretann Loveless, connected with  Crystal Soto  ([id number], [birth year]) on 12/31/22 at  2:45 PM EDT by a video-enabled telemedicine application and verified that I am speaking with the correct person using two identifiers.  Location: Patient: Virtual Visit  Location Patient: Home Provider: Virtual Visit Location Provider: Home Office   I discussed the limitations of evaluation and management by telemedicine and the availability of in person appointments. The patient expressed understanding and agreed to proceed.    History of Present Illness: Crystal Soto is a 27 y.o. who identifies as a female who was assigned female at birth, and is being seen today for mastitis.Breastfeeding for 2 months, right breast started about 2 days ago with a clogged duct. She continued to breast feed and do massages and that one improved. Then last night left side started to bother her. Woke up with left breast being more sore, swollen and red. Started having fatigue, body aches, low grade temp 100.4. She has been trying cold compresses and massage without improvement in symptoms.  She does report her infant just started sleeping through the night longer and she is an over-producer of milk, so since feedings have decreased she has had some more issues with clogged ducts recently.    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    Allergies: No Known Allergies Medications:  Current Outpatient Medications:    cephALEXin (KEFLEX) 500 MG capsule, Take 1 capsule (500 mg total) by mouth 4 (four) times daily for 10 days., Disp: 40 capsule, Rfl: 0   acetaminophen (TYLENOL) 325 MG tablet, Take 650 mg by mouth every 6 (six) hours as needed for mild pain or moderate  pain., Disp: , Rfl:    albuterol (VENTOLIN HFA) 108 (90 Base) MCG/ACT inhaler, Inhale 2 puffs into the lungs every 6 (six) hours as needed for wheezing or shortness of breath., Disp: 6.7 g, Rfl: 5   calcium elemental as carbonate (TUMS ULTRA 1000) 400 MG chewable tablet, Chew 1,000 mg by mouth daily as needed for heartburn., Disp: , Rfl:    CHOLINE PO, Take 110 mg by mouth daily., Disp: , Rfl:    docusate sodium (COLACE) 100 MG capsule, Take 100 mg by mouth daily as needed for mild constipation or moderate constipation., Disp: , Rfl:    famotidine (PEPCID) 20 MG tablet, Take 20 mg by mouth daily as needed for heartburn or indigestion., Disp: , Rfl:    ibuprofen (ADVIL) 600 MG tablet, Take 1 tablet (600 mg total) by mouth every 6 (six) hours., Disp: 30 tablet, Rfl: 0   loratadine (CLARITIN) 10 MG tablet, Take 10 mg by mouth daily as needed for allergies., Disp: , Rfl:    ondansetron (ZOFRAN-ODT) 4 MG disintegrating tablet, Take 1 tablet (4 mg total) by mouth every 8 (eight) hours as needed for nausea or vomiting., Disp: 20 tablet, Rfl: 0   Prenatal MV & Min w/FA-DHA (ONE A DAY PRENATAL PO), Take 1 tablet by mouth daily., Disp: , Rfl:   Observations/Objective: Patient is well-developed, well-nourished in no acute distress.  Resting comfortably at home.  Head is normocephalic, atraumatic.  No labored breathing.  Speech is clear and coherent with logical content.  Patient is alert and oriented at baseline.    Assessment and Plan: 1. Mastitis - cephALEXin (KEFLEX) 500 MG capsule; Take 1 capsule (500 mg total) by mouth 4 (four) times daily for 10 days.  Dispense: 40 capsule; Refill: 0  - Suspect Mastitis of the left breast - Keflex prescribed - Warm compresses - Massage - Continue feedings and pumping as scheduled - Tylenol as needed  - Push fluids - Seek in person evaluation if not improving or if symptoms worsen  Follow Up Instructions: I discussed the assessment and treatment plan with  the patient. The patient was provided an opportunity to ask questions and all were answered. The patient agreed with the plan and demonstrated an understanding of the instructions.  A copy of instructions were sent to the patient via MyChart unless otherwise noted below.    The patient was advised to call back or seek an in-person evaluation if the symptoms worsen or if the condition fails to improve as anticipated.    Margaretann Loveless, PA-C

## 2022-12-31 NOTE — Patient Instructions (Signed)
Kieth Brightly, thank you for joining Margaretann Loveless, PA-C for today's virtual visit.  While this provider is not your primary care provider (PCP), if your PCP is located in our provider database this encounter information will be shared with them immediately following your visit.   A [facility] MyChart account gives you access to today's visit and all your visits, tests, and labs performed at [facility] " click here if you don't have a [facility] MyChart account or go to [email or web address]  Consent: (Patient) Crystal Soto provided verbal consent for this virtual visit at the beginning of the encounter.  Current Medications:  Current Outpatient Medications:    cephALEXin (KEFLEX) 500 MG capsule, Take 1 capsule (500 mg total) by mouth 4 (four) times daily for 10 days., Disp: 40 capsule, Rfl: 0   acetaminophen (TYLENOL) 325 MG tablet, Take 650 mg by mouth every 6 (six) hours as needed for mild pain or moderate pain., Disp: , Rfl:    albuterol (VENTOLIN HFA) 108 (90 Base) MCG/ACT inhaler, Inhale 2 puffs into the lungs every 6 (six) hours as needed for wheezing or shortness of breath., Disp: 6.7 g, Rfl: 5   calcium elemental as carbonate (TUMS ULTRA 1000) 400 MG chewable tablet, Chew 1,000 mg by mouth daily as needed for heartburn., Disp: , Rfl:    CHOLINE PO, Take 110 mg by mouth daily., Disp: , Rfl:    docusate sodium (COLACE) 100 MG capsule, Take 100 mg by mouth daily as needed for mild constipation or moderate constipation., Disp: , Rfl:    famotidine (PEPCID) 20 MG tablet, Take 20 mg by mouth daily as needed for heartburn or indigestion., Disp: , Rfl:    ibuprofen (ADVIL) 600 MG tablet, Take 1 tablet (600 mg total) by mouth every 6 (six) hours., Disp: 30 tablet, Rfl: 0   loratadine (CLARITIN) 10 MG tablet, Take 10 mg by mouth daily as needed for allergies., Disp: , Rfl:    ondansetron (ZOFRAN-ODT) 4 MG disintegrating tablet, Take 1 tablet (4 mg total) by mouth  every 8 (eight) hours as needed for nausea or vomiting., Disp: 20 tablet, Rfl: 0   Prenatal MV & Min w/FA-DHA (ONE A DAY PRENATAL PO), Take 1 tablet by mouth daily., Disp: , Rfl:    Medications ordered in this encounter:  Meds ordered this encounter  Medications   cephALEXin (KEFLEX) 500 MG capsule    Sig: Take 1 capsule (500 mg total) by mouth 4 (four) times daily for 10 days.    Dispense:  40 capsule    Refill:  0    Order Specific Question:   Supervising Provider    Answer:   Merrilee Jansky [id number]     *If you need refills on other medications prior to your next appointment, please contact your pharmacy*  Follow-Up: Call back or seek an in-person evaluation if the symptoms worsen or if the condition fails to improve as anticipated.  [facility] [phone]  Other Instructions Mastitis  Mastitis is inflammation of the breast tissue. It most often happens in females who are breastfeeding. But it can happen to anyone, including males. It will sometimes go away on its own. Other times, mastitis may need treatment. What are the causes? In people who are not lactating, mastitis is usually caused by bacteria that gets into the breast tissue through cuts, cracks, or openings in the skin. Sometimes, mastitis can happen when there are no cuts or openings in the skin.  Other causes include: Nipple piercing. Some forms of breast cancer. What are the signs or symptoms? Symptoms of this condition include: Swelling, redness, tenderness, and pain in the breast. The area may also feel warm. Swelling of the glands under the arm. Nipple discharge. Tiredness (fatigue), headache, and flu-like muscle aches. Fever and chills. Nausea and vomiting. Symptoms can last 2-5 days. Breast pain and redness are at their worst on days 2 and 3, but can go away by day 5. If an infection develops and is not treated, a collection of pus (abscess) may form. How is this diagnosed? Mastitis is  often diagnosed based on a physical exam and your symptoms. You may have other tests, such as: Blood tests. Fluid tests. Fluid is removed from the abscess to check for bacteria. Mammogram or ultrasound tests. How is this treated? Treatment may include: Using hot or cold compresses on the affected area. Pain medicine. Antibiotic or steroid medicines. Self-care, such as rest and drinking more fluids. Removing fluid from an abscess, if one has formed. Follow these instructions at home: Breast care  Keep your nipples clean and dry. If told, put ice on the affected area. Put ice in a plastic bag. Place a towel between your skin and the bag. Leave the ice on for 20 minutes, 2-3 times a day. If told, apply heat to the affected area as often as told by your health care provider. Use the heat source that your provider recommends, such as a moist heat pack or a heating pad. Place a towel between your skin and the heat source. Leave the heat on for 20-30 minutes. If your skin turns bright red, remove the ice or heat right away to prevent skin damage. The risk of damage is higher if you cannot feel pain, heat, or cold. Medicines Take over-the-counter and prescription medicines only as told by your provider. If you were prescribed antibiotics, take them as told by your provider. Do not stop using the antibiotic even if you start to feel better. Contact a health care provider if: You have pus-like discharge coming from the nipple. You have a fever. Your pain and swelling get worse. Your symptoms do not start to get better within 2 days of starting treatment. Your pain is not helped by medicine. Get help right away if: You have a red line going from your breast toward your armpit. This information is not intended to replace advice given to you by your health care provider. Make sure you discuss any questions you have with your health care provider. Document Revised: 01/14/2022 Document Reviewed:  01/14/2022 Elsevier Patient Education  2024 Elsevier Inc.    If you have been instructed to have an in-person evaluation today at a local Urgent Care facility, please use the link below. It will take you to a list of all of our available [facility], including address, phone number and hours of operation. Please do not delay care.  [facility]  If you or a family member do not have a primary care provider, use the link below to schedule a visit and establish care. When you choose a [facility] primary care physician or advanced practice provider, you gain a long-term partner in health. Find a Primary Care Provider  Learn more about [facility]'s in-office and virtual care options:  - Get Care Now

## 2023-01-03 DIAGNOSIS — M9904 Segmental and somatic dysfunction of sacral region: Secondary | ICD-10-CM | POA: Diagnosis not present

## 2023-01-03 DIAGNOSIS — M9901 Segmental and somatic dysfunction of cervical region: Secondary | ICD-10-CM | POA: Diagnosis not present

## 2023-01-03 DIAGNOSIS — M9903 Segmental and somatic dysfunction of lumbar region: Secondary | ICD-10-CM | POA: Diagnosis not present

## 2023-01-03 DIAGNOSIS — M9902 Segmental and somatic dysfunction of thoracic region: Secondary | ICD-10-CM | POA: Diagnosis not present

## 2023-01-04 DIAGNOSIS — M9903 Segmental and somatic dysfunction of lumbar region: Secondary | ICD-10-CM | POA: Diagnosis not present

## 2023-01-04 DIAGNOSIS — M9904 Segmental and somatic dysfunction of sacral region: Secondary | ICD-10-CM | POA: Diagnosis not present

## 2023-01-04 DIAGNOSIS — M9902 Segmental and somatic dysfunction of thoracic region: Secondary | ICD-10-CM | POA: Diagnosis not present

## 2023-01-04 DIAGNOSIS — M9901 Segmental and somatic dysfunction of cervical region: Secondary | ICD-10-CM | POA: Diagnosis not present

## 2023-01-05 ENCOUNTER — Other Ambulatory Visit: Payer: Self-pay | Admitting: Family Medicine

## 2023-01-05 ENCOUNTER — Other Ambulatory Visit (HOSPITAL_BASED_OUTPATIENT_CLINIC_OR_DEPARTMENT_OTHER): Payer: Self-pay

## 2023-01-05 MED ORDER — ALBUTEROL SULFATE HFA 108 (90 BASE) MCG/ACT IN AERS
2.0000 | INHALATION_SPRAY | Freq: Four times a day (QID) | RESPIRATORY_TRACT | 5 refills | Status: AC | PRN
  Filled 2023-01-05: qty 6.7, 25d supply, fill #0

## 2023-01-11 ENCOUNTER — Other Ambulatory Visit (HOSPITAL_BASED_OUTPATIENT_CLINIC_OR_DEPARTMENT_OTHER): Payer: Self-pay

## 2023-01-11 MED ORDER — FLULAVAL 0.5 ML IM SUSY
0.5000 mL | PREFILLED_SYRINGE | Freq: Once | INTRAMUSCULAR | 0 refills | Status: AC
  Filled 2023-01-11: qty 0.5, 1d supply, fill #0

## 2023-01-11 MED ORDER — COMIRNATY 30 MCG/0.3ML IM SUSY
0.3000 mL | PREFILLED_SYRINGE | Freq: Once | INTRAMUSCULAR | 0 refills | Status: AC
  Filled 2023-01-11: qty 0.3, 1d supply, fill #0

## 2023-01-12 ENCOUNTER — Other Ambulatory Visit (HOSPITAL_BASED_OUTPATIENT_CLINIC_OR_DEPARTMENT_OTHER): Payer: Self-pay

## 2023-02-11 DIAGNOSIS — Z30431 Encounter for routine checking of intrauterine contraceptive device: Secondary | ICD-10-CM | POA: Diagnosis not present

## 2023-02-16 ENCOUNTER — Other Ambulatory Visit (INDEPENDENT_AMBULATORY_CARE_PROVIDER_SITE_OTHER): Payer: 59

## 2023-02-16 DIAGNOSIS — Z0000 Encounter for general adult medical examination without abnormal findings: Secondary | ICD-10-CM | POA: Diagnosis not present

## 2023-02-16 LAB — LIPID PANEL
Cholesterol: 185 mg/dL (ref 0–200)
HDL: 49.2 mg/dL (ref >39.00)
LDL Cholesterol: 124 mg/dL — ABNORMAL HIGH (ref 0–99)
NonHDL: 136.22
Total CHOL/HDL Ratio: 4
Triglycerides: 59 mg/dL (ref 0.0–149.0)
VLDL: 11.8 mg/dL (ref 0.0–40.0)

## 2023-02-21 ENCOUNTER — Other Ambulatory Visit (HOSPITAL_BASED_OUTPATIENT_CLINIC_OR_DEPARTMENT_OTHER): Payer: Self-pay

## 2023-02-22 ENCOUNTER — Other Ambulatory Visit (HOSPITAL_BASED_OUTPATIENT_CLINIC_OR_DEPARTMENT_OTHER): Payer: Self-pay

## 2023-02-22 MED ORDER — CEPHALEXIN 500 MG PO CAPS
500.0000 mg | ORAL_CAPSULE | Freq: Four times a day (QID) | ORAL | 0 refills | Status: DC
  Filled 2023-02-22: qty 20, 5d supply, fill #0

## 2023-03-28 ENCOUNTER — Other Ambulatory Visit (HOSPITAL_BASED_OUTPATIENT_CLINIC_OR_DEPARTMENT_OTHER): Payer: Self-pay

## 2023-03-28 ENCOUNTER — Telehealth: Payer: 59 | Admitting: Physician Assistant

## 2023-03-28 DIAGNOSIS — N61 Mastitis without abscess: Secondary | ICD-10-CM

## 2023-03-28 MED ORDER — CEPHALEXIN 500 MG PO CAPS
500.0000 mg | ORAL_CAPSULE | Freq: Four times a day (QID) | ORAL | 0 refills | Status: DC
  Filled 2023-03-28: qty 20, 5d supply, fill #0

## 2023-03-28 NOTE — Progress Notes (Signed)
Virtual Visit Consent   Crystal Soto, you are scheduled for a virtual visit with a [facility] provider today. Just as with appointments in the office, your consent must be obtained to participate. Your consent will be active for this visit and any virtual visit you may have with one of our providers in the next 365 days. If you have a MyChart account, a copy of this consent can be sent to you electronically.  As this is a virtual visit, video technology does not allow for your provider to perform a traditional examination. This may limit your provider's ability to fully assess your condition. If your provider identifies any concerns that need to be evaluated in person or the need to arrange testing (such as labs, EKG, etc.), we will make arrangements to do so. Although advances in technology are sophisticated, we cannot ensure that it will always work on either your end or our end. If the connection with a video visit is poor, the visit may have to be switched to a telephone visit. With either a video or telephone visit, we are not always able to ensure that we have a secure connection.  By engaging in this virtual visit, you consent to the provision of healthcare and authorize for your insurance to be billed (if applicable) for the services provided during this visit. Depending on your insurance coverage, you may receive a charge related to this service.  I need to obtain your verbal consent now. Are you willing to proceed with your visit today? Crystal Soto has provided verbal consent on 03/28/2023 for a virtual visit (video or telephone). Margaretann Loveless, PA-C  Date: 03/28/2023 2:40 PM  Virtual Visit via Video Note   I, Margaretann Loveless, connected with  Crystal Soto  ([id number], 05-Aug-1995) on 03/28/23 at  2:30 PM EST by a video-enabled telemedicine application and verified that I am speaking with the correct person using two identifiers.  Location: Patient: Virtual Visit  Location Patient: Home Provider: Virtual Visit Location Provider: Home Office   I discussed the limitations of evaluation and management by telemedicine and the availability of in person appointments. The patient expressed understanding and agreed to proceed.    History of Present Illness: Crystal Soto is a 27 y.o. who identifies as a female who was assigned female at birth, and is being seen today for mastitis of the left breast. Started having issues with blocked ducts on 03/24/23 in both breast due to spacing out feedings longer. Has been trying heat and massage without relief. Now having increased pain, swelling and redness of the left breast. Right breast did release overnight. Denies any fevers, chills, myalgias, nausea, vomiting at this time.   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    Allergies: No Known Allergies Medications:  Current Outpatient Medications:    cephALEXin (KEFLEX) 500 MG capsule, Take 1 capsule (500 mg total) by mouth 4 (four) times daily., Disp: 20 capsule, Rfl: 0   acetaminophen (  TYLENOL) 325 MG tablet, Take 650 mg by mouth every 6 (six) hours as needed for mild pain or moderate pain., Disp: , Rfl:    albuterol (VENTOLIN HFA) 108 (90 Base) MCG/ACT inhaler, Inhale 2 puffs into the lungs every 6 (six) hours as needed for wheezing or shortness of breath., Disp: 6.7 g, Rfl: 5   calcium elemental as carbonate (TUMS ULTRA 1000) 400 MG chewable tablet,  Chew 1,000 mg by mouth daily as needed for heartburn., Disp: , Rfl:    CHOLINE PO, Take 110 mg by mouth daily., Disp: , Rfl:    docusate sodium (COLACE) 100 MG capsule, Take 100 mg by mouth daily as needed for mild constipation or moderate constipation., Disp: , Rfl:    famotidine (PEPCID) 20 MG tablet, Take 20 mg by mouth daily as needed for heartburn or indigestion., Disp: , Rfl:    ibuprofen (ADVIL) 600 MG tablet, Take 1 tablet (600 mg total) by mouth every 6 (six) hours., Disp: 30 tablet, Rfl: 0   loratadine (CLARITIN) 10 MG tablet, Take 10 mg by mouth daily as needed for allergies., Disp: , Rfl:    ondansetron (ZOFRAN-ODT) 4 MG disintegrating tablet, Take 1 tablet (4 mg total) by mouth every 8 (eight) hours as needed for nausea or vomiting., Disp: 20 tablet, Rfl: 0   Prenatal MV & Min w/FA-DHA (ONE A DAY PRENATAL PO), Take 1 tablet by mouth daily., Disp: , Rfl:   Observations/Objective: Patient is well-developed, well-nourished in no acute distress.  Resting comfortably at home.  Head is normocephalic, atraumatic.  No labored breathing.  Speech is clear and coherent with logical content.  Patient is alert and oriented at baseline.    Assessment and Plan: 1. Mastitis (Primary) - cephALEXin (KEFLEX) 500 MG capsule; Take 1 capsule (500 mg total) by mouth 4 (four) times daily.  Dispense: 20 capsule; Refill: 0  - Keflex prescribed for early Mastitis - Can continue conservative measures with milking, massage, warm compresses - Tylenol and/or Ibuprofen if needed - Seek in person evaluation if fails to improve or worsens  Follow Up Instructions: I discussed the assessment and treatment plan with the patient. The patient was provided an opportunity to ask questions and all were answered. The patient agreed with the plan and demonstrated an understanding of the instructions.  A copy of instructions were sent to the patient via MyChart unless otherwise noted below.    The patient was advised  to call back or seek an in-person evaluation if the symptoms worsen or if the condition fails to improve as anticipated.    Margaretann Loveless, PA-C

## 2023-03-28 NOTE — Patient Instructions (Signed)
Kieth Brightly, thank you for joining Margaretann Loveless, PA-C for today's virtual visit.  While this provider is not your primary care provider (PCP), if your PCP is located in our provider database this encounter information will be shared with them immediately following your visit.   A [facility] MyChart account gives you access to today's visit and all your visits, tests, and labs performed at [facility] " click here if you don't have a [facility] MyChart account or go to [email or web address]  Consent: (Patient) Crystal Soto provided verbal consent for this virtual visit at the beginning of the encounter.  Current Medications:  Current Outpatient Medications:    cephALEXin (KEFLEX) 500 MG capsule, Take 1 capsule (500 mg total) by mouth 4 (four) times daily., Disp: 20 capsule, Rfl: 0   acetaminophen (TYLENOL) 325 MG tablet, Take 650 mg by mouth every 6 (six) hours as needed for mild pain or moderate pain., Disp: , Rfl:    albuterol (VENTOLIN HFA) 108 (90 Base) MCG/ACT inhaler, Inhale 2 puffs into the lungs every 6 (six) hours as needed for wheezing or shortness of breath., Disp: 6.7 g, Rfl: 5   calcium elemental as carbonate (TUMS ULTRA 1000) 400 MG chewable tablet, Chew 1,000 mg by mouth daily as needed for heartburn., Disp: , Rfl:    CHOLINE PO, Take 110 mg by mouth daily., Disp: , Rfl:    docusate sodium (COLACE) 100 MG capsule, Take 100 mg by mouth daily as needed for mild constipation or moderate constipation., Disp: , Rfl:    famotidine (PEPCID) 20 MG tablet, Take 20 mg by mouth daily as needed for heartburn or indigestion., Disp: , Rfl:    ibuprofen (ADVIL) 600 MG tablet, Take 1 tablet (600 mg total) by mouth every 6 (six) hours., Disp: 30 tablet, Rfl: 0   loratadine (CLARITIN) 10 MG tablet, Take 10 mg by mouth daily as needed for allergies., Disp: , Rfl:    ondansetron (ZOFRAN-ODT) 4 MG disintegrating tablet, Take 1 tablet (4 mg total) by mouth every 8  (eight) hours as needed for nausea or vomiting., Disp: 20 tablet, Rfl: 0   Prenatal MV & Min w/FA-DHA (ONE A DAY PRENATAL PO), Take 1 tablet by mouth daily., Disp: , Rfl:    Medications ordered in this encounter:  Meds ordered this encounter  Medications   cephALEXin (KEFLEX) 500 MG capsule    Sig: Take 1 capsule (500 mg total) by mouth 4 (four) times daily.    Dispense:  20 capsule    Refill:  0    Supervising Provider:   Merrilee Jansky [[id number]]     *If you need refills on other medications prior to your next appointment, please contact your pharmacy*  Follow-Up: Call back or seek an in-person evaluation if the symptoms worsen or if the condition fails to improve as anticipated.  [facility] [phone]  Other Instructions  Breastfeeding and Mastitis Mastitis is inflammation of the breast. It can happen in people who are breastfeeding. This can make breastfeeding painful. Mastitis will sometimes go away on its own with continued breastfeeding or pumping, especially if it is not caused by an infection. A health care provider will help decide if medical treatment is needed. What are the causes? Inflammation may be caused by a blocked milkduct, which can happen when too much milk builds up in the breast. Mastitis is often caused by an infection from germs (bacteria). Bacteria may enter the breast through cuts, cracks,  or openings in the skin near the nipple area. Cracks in the skin often develop when your child does not latch the correct way on to your breast. What increases the risk? The following factors may make you more likely to develop this condition: Having had mastitis before. Not using correct breastfeeding techniques. Not eating enough healthy foods and not drinking enough water. Smoking. Stress. Tiredness (fatigue). What are the signs or symptoms? Symptoms of this condition include: Swelling, redness, and pain in an area of your breast. These usually  affect the upper part of the breast, toward the armpit area. In most cases, these symptoms affect only one breast. In some cases, the symptoms may happen in both breasts at the same time and affect a larger part of the breast. Swelling of the glands under your arm on the same side as the redness, swelling, and pain. Fatigue, headache, and muscle aches. Fever. Symptoms usually last 2-5 days. Breast pain and redness are at their worst on days 2 and 3, and they usually go away by day 5. If not treated appropriately a collection of pus (abscess) may develop. How is this diagnosed? This condition is usually diagnosed based on: Your symptoms. A physical exam. In some cases, tests may be done, such as: Blood tests. Mammogram. Ultrasound. Fluid tests. If an abscess has developed, the fluid in the abscess may be checked to see whether there are bacteria in it. Breast milk testing. A sample of your breast milk may be tested for bacteria. How is this treated? This condition will sometimes go away on its own with continued breastfeeding or pumping. Your provider may choose to wait 24 hours after first seeing you to decide whether treatment is needed. If treatment is needed, it may include: Continuing to breastfeed or pump from both breasts to allow enough milk flow and prevent an abscess from forming. Rest. Drinking more fluids. Pain medicine. Antibiotics. Removing fluid from an abscess, if you have one. Follow these instructions at home: Medicines Take over-the-counter and prescription medicines only as told by your provider. If you were prescribed antibiotics, take them as told by your provider. Do not stop using the antibiotic even if you start to feel better. Managing pain and swelling  If told, put ice on the affected area of your breast right after breastfeeding or pumping. Put ice in a plastic bag. Place a towel between your skin and the bag. Leave the ice on for 20 minutes. If your skin  turns bright red, remove the ice right away to prevent skin damage. The risk of damage is higher if you cannot feel pain, heat, or cold. Breast care Keep your nipples clean and dry. Do not wear a tight bra or underwire bra. Wear a soft, supportive bra. Check your nipples for any cracks or blisters. If you find any, talk with your provider or breastfeeding specialist (lactation consultant). Breastfeeding and pumping tips Continue to breastfeed your baby on demand. This means feeding anytime your baby shows signs of hunger or you need to reduce the fullness of your breasts. Ask your provider or lactation consultant whether you need to change your breastfeeding or pumping routine. Alternate the breast you offer first at each feeding to make sure your baby feeds from both breasts equally. Avoid using nipple shields for feedings, if possible. Ask a lactation consultant for help if needed. If told, apply heat to the breast right before breastfeeding or pumping. Use the heat source that your provider recommends, such as a  moist heat pack or a heating pad. Use gentle breast massage during feeding or pumping sessions only as told by your provider or lactation consultant. Avoid allowing your breasts to become too full of milk (engorged). If your breasts are engorged, you can hand express a small amount of milk for comfort. If you are pumping, continue to pump on the same schedule as you were before. In the breast with mastitis, pump until very little milk is coming out. Do not empty your breast. Emptying your breast causes your body to make more milk and can make symptoms worse. General instructions Drink enough fluid to keep your pee (urine) pale yellow. This is especially important if you have a fever. Get plenty of rest. Keep all follow-up visits. Your provider will need to check how breastfeeding is going. Where to find more information [employer]: llli.org Contact a health care  provider if: You have become frustrated with breastfeeding or breast milk pumping. You have pain or discomfort in your breasts such as: Continued pain with breastfeeding or breast milk pumping. Breast engorgement that does not improve after 48-72 hours. Your symptoms do not improve with treatment. Your symptoms return after you have recovered from a breast infection. This information is not intended to replace advice given to you by your health care provider. Make sure you discuss any questions you have with your health care provider. Document Revised: 12/14/2021 Document Reviewed: 12/14/2021 Elsevier Patient Education  2024 Elsevier Inc.    If you have been instructed to have an in-person evaluation today at a local Urgent Care facility, please use the link below. It will take you to a list of all of our available [facility], including address, phone number and hours of operation. Please do not delay care.  [facility]  If you or a family member do not have a primary care provider, use the link below to schedule a visit and establish care. When you choose a [facility] primary care physician or advanced practice provider, you gain a long-term partner in health. Find a Primary Care Provider  Learn more about [facility]'s in-office and virtual care options: [facility] - Get Care Now

## 2023-04-25 ENCOUNTER — Encounter: Payer: Self-pay | Admitting: Family Medicine

## 2023-07-01 ENCOUNTER — Other Ambulatory Visit (HOSPITAL_BASED_OUTPATIENT_CLINIC_OR_DEPARTMENT_OTHER): Payer: Self-pay

## 2023-07-01 ENCOUNTER — Ambulatory Visit (INDEPENDENT_AMBULATORY_CARE_PROVIDER_SITE_OTHER): Admitting: Family Medicine

## 2023-07-01 ENCOUNTER — Encounter: Payer: Self-pay | Admitting: Family Medicine

## 2023-07-01 ENCOUNTER — Other Ambulatory Visit (HOSPITAL_COMMUNITY)
Admission: RE | Admit: 2023-07-01 | Discharge: 2023-07-01 | Disposition: A | Discharge status: Home / Self Care | Source: Ambulatory Visit | Attending: Family Medicine | Admitting: Family Medicine

## 2023-07-01 VITALS — BP 114/80 | HR 67 | Ht 64.0 in | Wt 133.8 lb

## 2023-07-01 DIAGNOSIS — R350 Frequency of micturition: Secondary | ICD-10-CM

## 2023-07-01 DIAGNOSIS — N898 Other specified noninflammatory disorders of vagina: Secondary | ICD-10-CM | POA: Insufficient documentation

## 2023-07-01 LAB — POCT URINALYSIS DIPSTICK
Bilirubin, UA: NEGATIVE
Blood, UA: NEGATIVE
Glucose, UA: NEGATIVE
Ketones, UA: NEGATIVE
Leukocytes, UA: NEGATIVE
Nitrite, UA: NEGATIVE
Protein, UA: NEGATIVE
Spec Grav, UA: <=1.005 — AB (ref 1.010–1.025)
Urobilinogen, UA: 0.2 U/dL
pH, UA: 5 (ref 5.0–8.0)

## 2023-07-01 MED ORDER — CEPHALEXIN 500 MG PO CAPS
500.0000 mg | ORAL_CAPSULE | Freq: Three times a day (TID) | ORAL | 0 refills | Status: AC
  Filled 2023-07-01: qty 21, 7d supply, fill #0

## 2023-07-01 NOTE — Patient Instructions (Signed)
 Stay hydrated.   Warning signs/symptoms: Uncontrollable nausea/vomiting, fevers, worsening symptoms despite treatment, confusion.  Give [place] around 2 business days to get culture back to you.  Let us know if you need anything.

## 2023-07-01 NOTE — Progress Notes (Signed)
 Chief Complaint  Patient presents with   Acute Visit    Patient presents today for frequency, pressure urination and some itching.    Crystal Soto is a 28 y.o. female here for possible UTI.  Duration: 3 days. Symptoms: Urgency, urinary frequency and abd pressure; vag dc and itching over past few d Denies: hematuria, urinary hesitancy, urinary retention, fever, nausea, vomiting, flank pain Hx of recurrent UTI? No Denies new sexual partners.  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 Right breast.  Follow by Dr. Alva Garnet.  Stable.  No intervention.   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 She said that she reduced her caffeine intake and it is much better now. 10/15/20 EKG showed NSR w/ sinus arrythmia, 10/15/20 Echocardiogram LVEF 55-60%, 10/15/20 Zio monitor all in Epic. LOV with cardiology, Dr. Josiah Lobo on 09/25/20 in Epic.   Seasonal allergies    Uterine leiomyoma 03/04/2021   Wears contact lenses      BP 114/80   Pulse 67   Ht 5\' 4"  (1.626 m)   Wt 133 lb 12.8 oz (60.7 kg)   SpO2 99%   BMI 22.97 kg/m  General: Awake, alert, appears stated age Heart: RRR Lungs: CTAB, normal respiratory effort, no accessory muscle usage Abd: BS+, soft, mild ttp in suprapubic region, ND, no masses or organomegaly MSK: No CVA tenderness, neg Lloyd's sign Psych: Age appropriate judgment and insight  Urinary frequency - Plan: POCT urinalysis dipstick, Urine Culture  Vaginal pruritus - Plan: Cervicovaginal ancillary only( )  Ck above. UA neg. 7 days of Keflex sent in, she is  breast-feeding.  She will take if she starts feeling worse or if the culture is positive.  Stay hydrated.  Will also check for BV, trichomonas, and yeast.  She will self swab today. Seek immediate care if pt starts to develop fevers, new/worsening symptoms, uncontrollable N/V. F/u prn. The patient voiced understanding and agreement to the plan.  [name], DO 07/01/23 11:57 AM

## 2023-07-02 LAB — URINE CULTURE
MICRO NUMBER:: 16290418
Result:: NO GROWTH
SPECIMEN QUALITY:: ADEQUATE

## 2023-07-03 ENCOUNTER — Encounter: Payer: Self-pay | Admitting: Family Medicine

## 2023-07-04 ENCOUNTER — Other Ambulatory Visit (HOSPITAL_BASED_OUTPATIENT_CLINIC_OR_DEPARTMENT_OTHER): Payer: Self-pay

## 2023-07-04 ENCOUNTER — Other Ambulatory Visit: Payer: Self-pay | Admitting: Family Medicine

## 2023-07-04 LAB — CERVICOVAGINAL ANCILLARY ONLY
Bacterial Vaginitis (gardnerella): POSITIVE — AB
Candida Glabrata: NEGATIVE
Candida Vaginitis: NEGATIVE
Comment: NEGATIVE
Comment: NEGATIVE
Comment: NEGATIVE
Comment: NEGATIVE
Trichomonas: NEGATIVE

## 2023-07-04 MED ORDER — METRONIDAZOLE 0.75 % VA GEL
1.0000 | Freq: Every day | VAGINAL | 0 refills | Status: DC
  Filled 2023-07-04: qty 70, 7d supply, fill #0

## 2023-07-27 ENCOUNTER — Other Ambulatory Visit: Payer: Self-pay | Admitting: Family Medicine

## 2023-07-27 ENCOUNTER — Other Ambulatory Visit (HOSPITAL_BASED_OUTPATIENT_CLINIC_OR_DEPARTMENT_OTHER): Payer: Self-pay

## 2023-07-27 MED ORDER — METRONIDAZOLE 0.75 % VA GEL
1.0000 | Freq: Every day | VAGINAL | 0 refills | Status: AC
  Filled 2023-07-27: qty 70, 7d supply, fill #0

## 2023-08-08 ENCOUNTER — Other Ambulatory Visit (HOSPITAL_BASED_OUTPATIENT_CLINIC_OR_DEPARTMENT_OTHER): Payer: Self-pay

## 2023-08-15 ENCOUNTER — Other Ambulatory Visit: Payer: Self-pay | Admitting: Family Medicine

## 2023-08-15 ENCOUNTER — Other Ambulatory Visit (HOSPITAL_BASED_OUTPATIENT_CLINIC_OR_DEPARTMENT_OTHER): Payer: Self-pay

## 2023-08-15 MED ORDER — METRONIDAZOLE 500 MG PO TABS
500.0000 mg | ORAL_TABLET | Freq: Two times a day (BID) | ORAL | 0 refills | Status: AC
  Filled 2023-08-15: qty 14, 7d supply, fill #0

## 2023-09-05 ENCOUNTER — Other Ambulatory Visit (HOSPITAL_BASED_OUTPATIENT_CLINIC_OR_DEPARTMENT_OTHER): Payer: Self-pay

## 2023-09-05 MED ORDER — METRONIDAZOLE 500 MG PO TABS
500.0000 mg | ORAL_TABLET | Freq: Two times a day (BID) | ORAL | 0 refills | Status: AC
  Filled 2023-09-05 (×2): qty 14, 7d supply, fill #0

## 2023-09-05 MED ORDER — LEVONORGESTREL-ETHINYL ESTRAD 0.1-20 MG-MCG PO TABS
1.0000 | ORAL_TABLET | Freq: Every day | ORAL | 5 refills | Status: AC
  Filled 2023-09-05: qty 84, 84d supply, fill #0
  Filled 2023-11-16: qty 84, 84d supply, fill #1
  Filled 2024-02-13: qty 84, 84d supply, fill #2
  Filled 2024-05-04: qty 84, 84d supply, fill #3

## 2023-09-16 ENCOUNTER — Encounter: Payer: Self-pay | Admitting: Family Medicine

## 2023-09-16 ENCOUNTER — Ambulatory Visit (INDEPENDENT_AMBULATORY_CARE_PROVIDER_SITE_OTHER): Payer: 59 | Admitting: Family Medicine

## 2023-09-16 ENCOUNTER — Ambulatory Visit: Payer: Self-pay | Admitting: Family Medicine

## 2023-09-16 VITALS — BP 110/70 | HR 61 | Temp 98.0°F | Resp 16 | Ht 64.0 in | Wt 133.6 lb

## 2023-09-16 DIAGNOSIS — Z0000 Encounter for general adult medical examination without abnormal findings: Secondary | ICD-10-CM

## 2023-09-16 LAB — LIPID PANEL
Cholesterol: 176 mg/dL (ref 0–200)
HDL: 47.8 mg/dL (ref >39.00)
LDL Cholesterol: 108 mg/dL — ABNORMAL HIGH (ref 0–99)
NonHDL: 128.2
Total CHOL/HDL Ratio: 4
Triglycerides: 100 mg/dL (ref 0.0–149.0)
VLDL: 20 mg/dL (ref 0.0–40.0)

## 2023-09-16 LAB — COMPREHENSIVE METABOLIC PANEL WITH GFR
ALT: 19 U/L (ref 0–35)
AST: 16 U/L (ref 0–37)
Albumin: 4.7 g/dL (ref 3.5–5.2)
Alkaline Phosphatase: 55 U/L (ref 39–117)
BUN: 10 mg/dL (ref 6–23)
CO2: 31 meq/L (ref 19–32)
Calcium: 9.5 mg/dL (ref 8.4–10.5)
Chloride: 105 meq/L (ref 96–112)
Creatinine, Ser: 0.71 mg/dL (ref 0.40–1.20)
GFR: 115.83 mL/min (ref >60.00)
Glucose, Bld: 89 mg/dL (ref 70–99)
Potassium: 4.4 meq/L (ref 3.5–5.1)
Sodium: 141 meq/L (ref 135–145)
Total Bilirubin: 1 mg/dL (ref 0.2–1.2)
Total Protein: 6.7 g/dL (ref 6.0–8.3)

## 2023-09-16 LAB — CBC
HCT: 44.6 % (ref 36.0–46.0)
Hemoglobin: 15 g/dL (ref 12.0–15.0)
MCHC: 33.6 g/dL (ref 30.0–36.0)
MCV: 88.2 fl (ref 78.0–100.0)
Platelets: 222 10*3/uL (ref 150.0–400.0)
RBC: 5.06 Mil/uL (ref 3.87–5.11)
RDW: 12.6 % (ref 11.5–15.5)
WBC: 4 10*3/uL (ref 4.0–10.5)

## 2023-09-16 NOTE — Progress Notes (Signed)
 Chief Complaint  Patient presents with   Annual Exam    CPE     Well Woman Crystal Soto is here for a complete physical.   Her last physical was >1 year ago.  Current diet: in general, a healthy diet. Current exercise: strength training, cardio. Contraception? Yes No LMP recorded. Fatigue out of ordinary? No Seatbelt? Yes Advanced directive? 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 Right breast.  Follow by Dr. Ovid Blow.  Stable.  No intervention.   Fibroid uterus 05/05/2021   Hypercholesteremia 06/03/2016   Mucocele of lower lip 12/17/2016   Seasonal allergies    Uterine leiomyoma 03/04/2021   Wears contact lenses      Past Surgical History:  Procedure Laterality Date   CESAREAN SECTION N/A 10/21/2022   Procedure: CESAREAN SECTION;  Surgeon: Cyd Dowse, MD;  Location: [facility] LD [facility]S;  Service: Obstetrics;  Laterality: N/A;  request OB Fellow assist   MYOMECTOMY     ORAL MUCOCELE EXCISION  2018   WISDOM TOOTH EXTRACTION     as a teenager    Medications  Current Outpatient Medications on File Prior to Visit  Medication Sig Dispense Refill   albuterol  (VENTOLIN  HFA) 108 (90 Base) MCG/ACT inhaler Inhale 2 puffs into the lungs every 6 (six) hours as needed for wheezing or shortness of breath. 6.7 g 5   levonorgestrel -ethinyl estradiol  (ALESSE) 0.1-20 MG-MCG tablet Take 1 tablet by mouth daily. 84 tablet 5    Allergies No Known Allergies  Review of Systems: Constitutional:  no unexpected weight changes Eye:  no recent significant change in vision Ear/Nose/Mouth/Throat:  Ears:  no tinnitus or vertigo and no recent change in hearing Nose/Mouth/Throat:  no complaints of nasal congestion, no  sore throat Cardiovascular: no chest pain Respiratory:  no cough and no shortness of breath Gastrointestinal:  no abdominal pain, no change in bowel habits GU:  Female: negative for dysuria or pelvic pain Musculoskeletal/Extremities:  no pain of the joints Integumentary (Skin/Breast):  no abnormal skin lesions reported Neurologic:  no headaches Endocrine:  denies fatigue Hematologic/Lymphatic:  No areas of easy bleeding  Exam BP 110/70 (BP Location: Left Arm, Patient Position: Sitting)   Pulse 61   Temp 98 F (36.7 C) (Oral)   Resp 16   Ht 5' 4 (1.626 m)   Wt 133 lb 9.6 oz (60.6 kg)   SpO2 100%   BMI 22.93 kg/m  General:  well developed, well nourished, in no apparent distress Skin:  no significant moles, warts, or growths Head:  no masses, lesions, or tenderness Eyes:  pupils equal and round, sclera anicteric without injection Ears:  canals without lesions, TMs shiny without retraction, no obvious effusion, no erythema Nose:  nares patent, mucosa normal, and no drainage  Throat/Pharynx:  lips and gingiva without lesion; tongue and uvula midline; non-inflamed pharynx; no exudates or postnasal drainage Neck: neck supple without adenopathy, thyromegaly, or masses Lungs:  clear to auscultation, breath sounds equal bilaterally, no respiratory distress Cardio:  regular rate and rhythm, no bruits, no LE edema Abdomen:  abdomen soft, nontender; bowel sounds normal; no masses or organomegaly Genital: Defer to GYN Musculoskeletal:  symmetrical muscle groups noted without atrophy or deformity Extremities:  no  clubbing, cyanosis, or edema, no deformities, no skin discoloration Neuro:  gait normal; deep tendon reflexes normal and symmetric Psych: well oriented with normal range of affect and appropriate judgment/insight  Assessment and Plan  Well adult exam - Plan: CBC, Comprehensive metabolic panel with GFR, Lipid panel   Well 28 y.o. female. Counseled on diet and  exercise. Advanced directive form provided today.  Discussed PCV20, she politely declined today.  Other orders as above. Follow up in 1 yr. The patient voiced understanding and agreement to the plan.  Shellie Dials [place], DO 09/16/23 8:16 AM

## 2023-09-16 NOTE — Patient Instructions (Signed)
 Give [place] 2-3 business days to get the results of your labs back.   Keep the diet clean and stay active.  Please get me a copy of your advanced directive form at your convenience.   Let us know if you need anything.

## 2023-12-05 ENCOUNTER — Other Ambulatory Visit (HOSPITAL_BASED_OUTPATIENT_CLINIC_OR_DEPARTMENT_OTHER): Payer: Self-pay

## 2023-12-05 MED ORDER — FLUCONAZOLE 150 MG PO TABS
150.0000 mg | ORAL_TABLET | Freq: Every day | ORAL | 0 refills | Status: AC
  Filled 2023-12-05: qty 1, 1d supply, fill #0

## 2024-01-02 ENCOUNTER — Other Ambulatory Visit (HOSPITAL_BASED_OUTPATIENT_CLINIC_OR_DEPARTMENT_OTHER): Payer: Self-pay

## 2024-01-02 MED ORDER — FLUZONE 0.5 ML IM SUSY
0.5000 mL | PREFILLED_SYRINGE | Freq: Once | INTRAMUSCULAR | 0 refills | Status: AC
  Filled 2024-01-02: qty 0.5, 1d supply, fill #0

## 2024-01-28 ENCOUNTER — Encounter: Payer: Self-pay | Admitting: Family Medicine

## 2024-02-22 ENCOUNTER — Encounter: Payer: Self-pay | Admitting: Cardiology

## 2024-02-22 ENCOUNTER — Ambulatory Visit: Attending: Cardiology | Admitting: Cardiology

## 2024-02-22 VITALS — BP 118/68 | HR 74 | Ht 64.0 in | Wt 135.0 lb

## 2024-02-22 DIAGNOSIS — R011 Cardiac murmur, unspecified: Secondary | ICD-10-CM | POA: Diagnosis not present

## 2024-02-22 DIAGNOSIS — Z8249 Family history of ischemic heart disease and other diseases of the circulatory system: Secondary | ICD-10-CM | POA: Insufficient documentation

## 2024-02-22 DIAGNOSIS — E782 Mixed hyperlipidemia: Secondary | ICD-10-CM | POA: Insufficient documentation

## 2024-02-22 DIAGNOSIS — R002 Palpitations: Secondary | ICD-10-CM

## 2024-02-22 NOTE — Progress Notes (Signed)
 Cardiology Office Note:    Date:  02/22/2024   ID:  Crystal Soto, DOB 03-05-1996, MRN [id number]  PCP:  Frann Mabel Mt, DO  Cardiologist:  Jennifer JONELLE Crape, MD   Referring MD: Frann Mabel Mt*    ASSESSMENT:    1. Palpitations   2. Family history of aortic dissection   3. Family history of aortic aneurysm   4. Mixed dyslipidemia   5. Cardiac murmur    PLAN:    In order of problems listed above:  Primary prevention stressed with the patient.  Importance of compliance with diet medication stressed and patient verbalized standing. Family history of aortic dissection and aortic aneurysm: We will do a CT scan to assess this.  She prefers a calcium scoring CT scan.  She is currently completely asymptomatic and exercise on a regular basis without symptoms. Cardiac murmur: Echocardiogram will be done to assess murmur heard on auscultation. Mixed dyslipidemia: Mild elevation in LDL diet was emphasized.  She is going to try to do better. Patient will be seen in follow-up appointment in 12 months or earlier if the patient has any concerns.    Medication Adjustments/Labs and Tests Ordered: Current medicines are reviewed at length with the patient today.  Concerns regarding medicines are outlined above.  No orders of the defined types were placed in this encounter.  No orders of the defined types were placed in this encounter.    No chief complaint on file.    History of Present Illness:    Crystal Soto is a 28 y.o. female.  Patient overall is in good health.  She mentions to me that her father passed away a few days ago while he was on a cruise with aortic dissection.  Patient's grandmother and grandfather has history of aortic aneurysm.  Her father had aortic aneurysm which was repaired.  She denies any chest pain orthopnea or PND.  She is here for follow-up.  She is here for screening.  This is because of the former events.  At the time of my evaluation,  the patient is alert awake oriented and in no distress.  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 Right breast.  Follow by Dr. Thedora.  Stable.  No intervention.   Fibroid uterus 05/05/2021   Hypercholesteremia 06/03/2016   Mucocele of lower lip 12/17/2016   Palpitations 08/08/2020   S/P cesarean section 10/21/2022   Seasonal allergies    Uterine leiomyoma 03/04/2021   Uterine scar from previous surgery affecting pregnancy 10/21/2022   Wears contact lenses     Past Surgical History:  Procedure Laterality Date   CESAREAN SECTION N/A 10/21/2022   Procedure: CESAREAN SECTION;  Surgeon: Horacio Boas, MD;  Location: [facility] LD [facility]S;  Service: Obstetrics;  Laterality: N/A;  request OB Fellow assist   MYOMECTOMY     ORAL MUCOCELE EXCISION  2018   WISDOM TOOTH EXTRACTION     as a teenager    Current Medications: Current Meds  Medication Sig   albuterol  (VENTOLIN  HFA) 108 (90 Base) MCG/ACT inhaler Inhale 2 puffs into the lungs every 6 (six) hours as needed for wheezing or shortness of breath.   fluconazole  (DIFLUCAN ) 150 MG tablet Take  1 tablet (150 mg total) by mouth once after antibiotic treatment.   levonorgestrel -ethinyl estradiol  (ALESSE) 0.1-20 MG-MCG tablet Take 1 tablet by mouth daily.     Allergies:   Patient has no known allergies.   Social History   Socioeconomic History   Marital status: Married    Spouse name: Not on file   Number of children: Not on file   Years of education: Not on file   Highest education level: Bachelor's degree (e.g., BA, AB, BS)  Occupational History   Not on file  Tobacco Use   Smoking status: Never   Smokeless tobacco: Never  Vaping Use   Vaping status: Never Used  Substance and  Sexual Activity   Alcohol use: Yes    Alcohol/week: 2.0 standard drinks of alcohol    Types: 2 Glasses of wine per week    Comment: occasionally   Drug use: Never   Sexual activity: Yes    Birth control/protection: Pill, Condom  Other Topics Concern   Not on file  Social History Narrative   Not on file   Social Drivers of Health   Financial Resource Strain: Low Risk  (06/30/2023)   Overall Financial Resource Strain (CARDIA)    Difficulty of Paying Living Expenses: Not hard at all  Food Insecurity: No Food Insecurity (06/30/2023)   Hunger Vital Sign    Worried About Running Out of Food in the Last Year: Never true    Ran Out of Food in the Last Year: Never true  Transportation Needs: No Transportation Needs (06/30/2023)   PRAPARE - Administrator, Civil Service (Medical): No    Lack of Transportation (Non-Medical): No  Physical Activity: Sufficiently Active (06/30/2023)   Exercise Vital Sign    Days of Exercise per Week: 5 days    Minutes of Exercise per Session: 30 min  Stress: No Stress Concern Present (06/30/2023)   [employer] of Occupational Health - Occupational Stress Questionnaire    Feeling of Stress : Only a little  Social Connections: Moderately Integrated (06/30/2023)   Social Connection and Isolation Panel    Frequency of Communication with Friends and Family: Three times a week    Frequency of Social Gatherings with Friends and Family: Once a week    Attends Religious Services: 1 to 4 times per year    Active Member of [employer] or Organizations: No    Attends Engineer, Structural: Not on file    Marital Status: Married     Family History: The patient's family history includes Cancer in her maternal grandmother; Heart disease in her paternal grandfather; High Cholesterol in her maternal grandfather, maternal grandmother, and mother; Hypertension in her maternal grandfather.  ROS:   Please see the history of present illness.    All other systems  reviewed and are negative.  EKGs/Labs/Other Studies Reviewed:    The following studies were reviewed today: I discussed my findings with the patient at length    Recent Labs: 09/16/2023: ALT 19; BUN 10; Creatinine, Ser 0.71; Hemoglobin 15.0; Platelets 222.0; Potassium 4.4; Sodium 141  Recent Lipid Panel    Component Value Date/Time   CHOL 176 09/16/2023 0824   TRIG 100.0 09/16/2023 0824   HDL 47.80 09/16/2023 0824   CHOLHDL 4 09/16/2023 0824   VLDL 20.0 09/16/2023 0824   LDLCALC 108 (H) 09/16/2023 0824    Physical Exam:    VS:  BP 118/68   Pulse 74   Ht 5' 4 (1.626 m)  Wt 135 lb (61.2 kg)   SpO2 98%   BMI 23.17 kg/m     Wt Readings from Last 3 Encounters:  02/22/24 135 lb (61.2 kg)  09/16/23 133 lb 9.6 oz (60.6 kg)  07/01/23 133 lb 12.8 oz (60.7 kg)     GEN: Patient is in no acute distress HEENT: Normal NECK: No JVD; No carotid bruits LYMPHATICS: No lymphadenopathy CARDIAC: Hear sounds regular, 2/6 systolic murmur at the apex. RESPIRATORY:  Clear to auscultation without rales, wheezing or rhonchi  ABDOMEN: Soft, non-tender, non-distended MUSCULOSKELETAL:  No edema; No deformity  SKIN: Warm and dry NEUROLOGIC:  Alert and oriented x 3 PSYCHIATRIC:  Normal affect   Signed, Jennifer JONELLE Crape, MD  02/22/2024 9:53 AM    [facility]

## 2024-02-22 NOTE — Patient Instructions (Signed)
 Medication Instructions:  Your physician recommends that you continue on your current medications as directed. Please refer to the Current Medication list given to you today.  *If you need a refill on your cardiac medications before your next appointment, please call your pharmacy*   Lab Work: None ordered If you have labs (blood work) drawn today and your tests are completely normal, you will receive your results only by: MyChart Message (if you have MyChart) OR A paper copy in the mail If you have any lab test that is abnormal or we need to change your treatment, we will call you to review the results.  Testing/Procedures: Your physician has requested that you have an echocardiogram. Echocardiography is a painless test that uses sound waves to create images of your heart. It provides your doctor with information about the size and shape of your heart and how well your heart's chambers and valves are working. This procedure takes approximately one hour. There are no restrictions for this procedure. Please do NOT wear cologne, perfume, aftershave, or lotions (deodorant is allowed). Please arrive 15 minutes prior to your appointment time.  Please note: We ask at that you not bring children with you during ultrasound (echo/ vascular) testing. Due to room size and safety concerns, children are not allowed in the ultrasound rooms during exams. Our front office staff cannot provide observation of children in our lobby area while testing is being conducted. An adult accompanying a patient to their appointment will only be allowed in the ultrasound room at the discretion of the ultrasound technician under special circumstances. We apologize for any inconvenience.  We will order CT coronary calcium  score. It will cost $99.00 and is due at time of scan.  Please call to schedule.    [facility] [address] [place], [place] [address] [phone]  Follow-Up: At [facility], you and your health needs are our priority.  As part of our continuing mission to provide you with exceptional heart care, we have created designated Provider Care Teams.  These Care Teams include your primary Cardiologist (physician) and Advanced Practice Providers (APPs -  Physician Assistants and Nurse Practitioners) who all work together to provide you with the care you need, when you need it.  We recommend signing up for the patient portal called MyChart.  Sign up information is provided on this After Visit Summary.  MyChart is used to connect with patients for Virtual Visits (Telemedicine).  Patients are able to view lab/test results, encounter notes, upcoming appointments, etc.  Non-urgent messages can be sent to your provider as well.   To learn more about what you can do with MyChart, go to [email or web address].    Your next appointment:   12 month(s)  The format for your next appointment:   In Person  Provider:   Jennifer Crape, MD   Other Instructions Echocardiogram An echocardiogram is a test that uses sound waves (ultrasound) to produce images of the heart. Images from an echocardiogram can provide important information about: Heart size and shape. The size and thickness and movement of your heart's walls. Heart muscle function and strength. Heart valve function or if you have stenosis. Stenosis is when the heart valves are too narrow. If blood is flowing backward through the heart valves (regurgitation). A tumor or infectious growth around the heart valves. Areas of heart muscle that are not working well because of poor blood flow or injury from a heart attack. Aneurysm detection. An aneurysm  is a weak or damaged part of an artery wall. The wall bulges out from the normal force of blood pumping through the body. Tell a health care provider about: Any allergies you have. All medicines you are taking, including vitamins, herbs, eye drops, creams, and  over-the-counter medicines. Any blood disorders you have. Any surgeries you have had. Any medical conditions you have. Whether you are pregnant or may be pregnant. What are the risks? Generally, this is a safe test. However, problems may occur, including an allergic reaction to dye (contrast) that may be used during the test. What happens before the test? No specific preparation is needed. You may eat and drink normally. What happens during the test? You will take off your clothes from the waist up and put on a hospital gown. Electrodes or electrocardiogram (ECG)patches may be placed on your chest. The electrodes or patches are then connected to a device that monitors your heart rate and rhythm. You will lie down on a table for an ultrasound exam. A gel will be applied to your chest to help sound waves pass through your skin. A handheld device, called a transducer, will be pressed against your chest and moved over your heart. The transducer produces sound waves that travel to your heart and bounce back (or echo back) to the transducer. These sound waves will be captured in real-time and changed into images of your heart that can be viewed on a video monitor. The images will be recorded on a computer and reviewed by your health care provider. You may be asked to change positions or hold your breath for a short time. This makes it easier to get different views or better views of your heart. In some cases, you may receive contrast through an IV in one of your veins. This can improve the quality of the pictures from your heart. The procedure may vary among health care providers and hospitals.   What can I expect after the test? You may return to your normal, everyday life, including diet, activities, and medicines, unless your health care provider tells you not to do that. Follow these instructions at home: It is up to you to get the results of your test. Ask your health care provider, or the  department that is doing the test, when your results will be ready. Keep all follow-up visits. This is important. Summary An echocardiogram is a test that uses sound waves (ultrasound) to produce images of the heart. Images from an echocardiogram can provide important information about the size and shape of your heart, heart muscle function, heart valve function, and other possible heart problems. You do not need to do anything to prepare before this test. You may eat and drink normally. After the echocardiogram is completed, you may return to your normal, everyday life, unless your health care provider tells you not to do that. This information is not intended to replace advice given to you by your health care provider. Make sure you discuss any questions you have with your health care provider. Document Revised: 11/06/2019 Document Reviewed: 11/06/2019 [employer] Patient Education  2021 [employer]   Important Information About Sugar

## 2024-03-06 ENCOUNTER — Encounter (HOSPITAL_BASED_OUTPATIENT_CLINIC_OR_DEPARTMENT_OTHER): Payer: Self-pay | Admitting: Radiology

## 2024-03-06 ENCOUNTER — Ambulatory Visit (HOSPITAL_BASED_OUTPATIENT_CLINIC_OR_DEPARTMENT_OTHER)
Admission: RE | Admit: 2024-03-06 | Discharge: 2024-03-06 | Disposition: A | Discharge status: Home / Self Care | Payer: Self-pay | Source: Ambulatory Visit | Attending: Cardiology | Admitting: Cardiology

## 2024-03-06 DIAGNOSIS — E782 Mixed hyperlipidemia: Secondary | ICD-10-CM | POA: Insufficient documentation

## 2024-03-07 ENCOUNTER — Other Ambulatory Visit (HOSPITAL_BASED_OUTPATIENT_CLINIC_OR_DEPARTMENT_OTHER)

## 2024-03-08 ENCOUNTER — Ambulatory Visit: Payer: Self-pay | Admitting: Cardiology

## 2024-03-15 ENCOUNTER — Ambulatory Visit (HOSPITAL_BASED_OUTPATIENT_CLINIC_OR_DEPARTMENT_OTHER)

## 2024-09-19 ENCOUNTER — Encounter: Admitting: Family Medicine

## 2024-09-21 ENCOUNTER — Encounter: Admitting: Family Medicine
